# Patient Record
Sex: Female | Born: 1990 | Race: Black or African American | Hispanic: No | Marital: Single | Smoking: Current some day smoker
Health system: Southern US, Community
[De-identification: ages and names within clinical notes are randomized; demographics above are authoritative.]

## PROBLEM LIST (undated history)

## (undated) DIAGNOSIS — R569 Unspecified convulsions: Secondary | ICD-10-CM

## (undated) DIAGNOSIS — J45909 Unspecified asthma, uncomplicated: Secondary | ICD-10-CM

## (undated) HISTORY — PX: BRAIN TUMOR EXCISION: SHX577

---

## 2013-09-01 ENCOUNTER — Emergency Department (HOSPITAL_COMMUNITY)
Admission: EM | Admit: 2013-09-01 | Discharge: 2013-09-01 | Disposition: A | Discharge status: Home / Self Care | Payer: Self-pay | Attending: Emergency Medicine | Admitting: Emergency Medicine

## 2013-09-01 ENCOUNTER — Emergency Department (HOSPITAL_COMMUNITY): Payer: Self-pay

## 2013-09-01 ENCOUNTER — Encounter (HOSPITAL_COMMUNITY): Payer: Self-pay | Admitting: Emergency Medicine

## 2013-09-01 DIAGNOSIS — J069 Acute upper respiratory infection, unspecified: Secondary | ICD-10-CM | POA: Insufficient documentation

## 2013-09-01 DIAGNOSIS — F17200 Nicotine dependence, unspecified, uncomplicated: Secondary | ICD-10-CM | POA: Insufficient documentation

## 2013-09-01 MED ORDER — GUAIFENESIN 100 MG/5ML PO SYRP: 100.0000 mg | ORAL_SOLUTION | ORAL | Status: DC | PRN

## 2013-09-01 NOTE — ED Notes (Signed)
Pt c/o chest and nasal congestion with cough

## 2013-09-01 NOTE — ED Provider Notes (Signed)
CSN: [id number]     Arrival date & time 09/01/13  1205 History   First MD Initiated Contact with Patient 09/01/13 1223     Chief Complaint  Patient presents with  . URI   (Consider location/radiation/quality/duration/timing/severity/associated sxs/prior Treatment) HPI Comments: Patient presents today with a chief complaint of nasal congestion, chest congestion, and productive cough.  Symptoms have been present for the past 3 days and are gradually worsening.  She reports that she took an OTC cough medicine for her symptoms, but does not think that it helped.  Patient denies fever, chills, SOB, or chest pain.  She reports that she currently smokes 5 cigarettes a day.  She also reports past history of Asthma.  She denies any wheezing or SOB.  Patient is a 22 y.o. female presenting with URI. The history is provided by the patient.  URI Presenting symptoms: congestion and cough   Presenting symptoms: no fever     History reviewed. No pertinent past medical history. History reviewed. No pertinent past surgical history. No family history on file. History  Substance Use Topics  . Smoking status: Current Every Day Smoker    Types: Cigarettes  . Smokeless tobacco: Not on file  . Alcohol Use: No   OB History   Grav Para Term Preterm Abortions TAB SAB Ect Mult Living                 Review of Systems  Constitutional: Negative for fever and chills.  HENT: Positive for congestion. Negative for sinus pressure.   Respiratory: Positive for cough.   All other systems reviewed and are negative.    Allergies  Review of patient's allergies indicates no known allergies.  Home Medications  No current outpatient prescriptions on file. BP 101/59  Pulse 78  Temp(Src) 98.8 F (37.1 C) (Oral)  Resp 18  SpO2 99%  LMP 08/13/2013 Physical Exam  Nursing note and vitals reviewed. Constitutional: She appears well-developed and well-nourished.  HENT:  Head: Normocephalic and atraumatic.  Right  Ear: Tympanic membrane and ear canal normal.  Left Ear: Tympanic membrane and ear canal normal.  Nose: Rhinorrhea present. Right sinus exhibits no maxillary sinus tenderness and no frontal sinus tenderness. Left sinus exhibits no maxillary sinus tenderness and no frontal sinus tenderness.  Mouth/Throat: Uvula is midline, oropharynx is clear and moist and mucous membranes are normal.  Neck: Normal range of motion. Neck supple.  Cardiovascular: Normal rate, regular rhythm and normal heart sounds.   Pulmonary/Chest: Effort normal and breath sounds normal. No respiratory distress. She has no wheezes. She has no rales.  Neurological: She is alert.  Skin: Skin is warm and dry.  Psychiatric: She has a normal mood and affect.    ED Course  Procedures (including critical care time) Labs Review Labs Reviewed - No data to display Imaging Review Dg Chest 2 View  09/01/2013   CLINICAL DATA:  Dry cough, shortness of breath  EXAM: CHEST  2 VIEW  COMPARISON:  None.  FINDINGS: The heart size and mediastinal contours are within normal limits. Both lungs are clear. The visualized skeletal structures are unremarkable.  IMPRESSION: No active cardiopulmonary disease.   Electronically Signed   By: Elige Ko   On: 09/01/2013 13:22    EKG Interpretation   None       MDM  No diagnosis found. Pt CXR negative for acute infiltrate. Patients symptoms are consistent with URI, likely viral etiology. Discussed that antibiotics are not indicated for viral infections. Pt will  be discharged with symptomatic treatment.  Verbalizes understanding and is agreeable with plan. Pt is hemodynamically stable & in NAD prior to dc.     Santiago Glad, PA-C 09/03/13 [phone]

## 2013-09-05 NOTE — ED Provider Notes (Signed)
Medical screening examination/treatment/procedure(s) were performed by non-physician practitioner and as supervising physician I was immediately available for consultation/collaboration.  EKG Interpretation   None      Medical screening examination/treatment/procedure(s) were performed by non-physician practitioner and as supervising physician I was immediately available for consultation/collaboration.  EKG Interpretation   None        Raeford Razor, MD 09/05/13 [phone]

## 2015-02-09 ENCOUNTER — Encounter (HOSPITAL_COMMUNITY): Payer: Self-pay | Admitting: Family Medicine

## 2015-02-09 ENCOUNTER — Emergency Department (HOSPITAL_COMMUNITY)
Admission: EM | Admit: 2015-02-09 | Discharge: 2015-02-09 | Disposition: A | Discharge status: Home / Self Care | Payer: Medicaid - Out of State | Attending: Emergency Medicine | Admitting: Emergency Medicine

## 2015-02-09 DIAGNOSIS — R2243 Localized swelling, mass and lump, lower limb, bilateral: Secondary | ICD-10-CM | POA: Insufficient documentation

## 2015-02-09 DIAGNOSIS — R600 Localized edema: Secondary | ICD-10-CM

## 2015-02-09 DIAGNOSIS — Z791 Long term (current) use of non-steroidal anti-inflammatories (NSAID): Secondary | ICD-10-CM | POA: Insufficient documentation

## 2015-02-09 DIAGNOSIS — Z720 Tobacco use: Secondary | ICD-10-CM | POA: Diagnosis not present

## 2015-02-09 LAB — I-STAT BETA HCG BLOOD, ED (MC, WL, AP ONLY): I-stat hCG, quantitative: <5 m[IU]/mL (ref <5)

## 2015-02-09 LAB — I-STAT CHEM 8, ED
BUN: 9 mg/dL (ref 6–23)
CREATININE: 0.7 mg/dL (ref 0.50–1.10)
Calcium, Ion: 1.24 mmol/L — ABNORMAL HIGH (ref 1.12–1.23)
Chloride: 104 mmol/L (ref 96–112)
Glucose, Bld: 88 mg/dL (ref 70–99)
HCT: 35 % — ABNORMAL LOW (ref 36.0–46.0)
HEMOGLOBIN: 11.9 g/dL — AB (ref 12.0–15.0)
Potassium: 4 mmol/L (ref 3.5–5.1)
Sodium: 142 mmol/L (ref 135–145)
TCO2: 23 mmol/L (ref 0–100)

## 2015-02-09 MED ORDER — FUROSEMIDE 20 MG PO TABS: 20.0000 mg | ORAL_TABLET | Freq: Every day | ORAL | Status: DC

## 2015-02-09 MED ORDER — POTASSIUM CHLORIDE ER 10 MEQ PO TBCR: 10.0000 meq | EXTENDED_RELEASE_TABLET | Freq: Every day | ORAL | Status: DC

## 2015-02-09 NOTE — ED Notes (Signed)
Pt states she drove to [place] this weekend and is now experiencing edema to bilateral lower legs.

## 2015-02-09 NOTE — ED Provider Notes (Signed)
CSN: [id number]     Arrival date & time 02/09/15  9147 History   First MD Initiated Contact with Patient 02/09/15 [phone]     Chief Complaint  Patient presents with  . Leg Swelling     The history is provided by the patient. No language interpreter was used.   Jenna Combs presents for evaluation of lower extremity edema. He's had 2-3 days of diffuse lower extremity swelling. She has pain in her feet bilaterally when she stands due to swelling. The pain is described as burning sensation. She denies any medical problems and recently travel to [place] the weekend. The swelling started while she was in [place]. She denies any fevers, chest pain, shortness of breath, change in her urination, vomiting, diarrhea. Symptoms are moderate, constant, worsening. She has tried an over-the-counter diuretic without relief.  History reviewed. No pertinent past medical history. History reviewed. No pertinent past surgical history. History reviewed. No pertinent family history. History  Substance Use Topics  . Smoking status: Current Every Day Smoker    Types: Cigarettes  . Smokeless tobacco: Not on file  . Alcohol Use: No   OB History    No data available     Review of Systems  All other systems reviewed and are negative.     Allergies  Review of patient's allergies indicates no known allergies.  Home Medications   Prior to Admission medications   Medication Sig Start Date End Date Taking? Authorizing Provider  guaifenesin (ROBITUSSIN) 100 MG/5ML syrup Take 5-10 mLs (100-200 mg total) by mouth every 4 (four) hours as needed for cough. 09/01/13   Heather Laisure, PA-C  naproxen sodium (ANAPROX) 220 MG tablet Take 220 mg by mouth 2 (two) times daily with a meal.    Historical Provider, MD   Pulse 66  Temp(Src) 98.1 F (36.7 C) (Oral)  Resp 20  SpO2 100%  LMP 02/08/2015 Physical Exam  Constitutional: She is oriented to person, place, and time. She appears well-developed and  well-nourished.  HENT:  Head: Normocephalic and atraumatic.  Cardiovascular: Normal rate and regular rhythm.   No murmur heard. Pulmonary/Chest: Effort normal and breath sounds normal. No respiratory distress.  Abdominal: Soft. There is no tenderness. There is no rebound and no guarding.  Musculoskeletal:  2+ nonpitting edema in bilateral lower extremities, greatest over the feet. 2+ DP pulses bilaterally. There is no erythema or induration of bilateral lower extremities.  Neurological: She is alert and oriented to person, place, and time.  Skin: Skin is warm and dry.  Psychiatric: She has a normal mood and affect. Her behavior is normal.  Nursing note and vitals reviewed.   ED Course  Procedures (including critical care time) Labs Review Labs Reviewed  I-STAT CHEM 8, ED - Abnormal; Notable for the following:    Calcium, Ion 1.24 (*)    Hemoglobin 11.9 (*)    HCT 35.0 (*)    All other components within normal limits  I-STAT BETA HCG BLOOD, ED ([facility], [facility], [facility] ONLY)    Imaging Review No results found.   EKG Interpretation None      MDM   Final diagnoses:  Bilateral edema of lower extremity    Patient here for evaluation of lower extremity edema. History and presentation is not consistent with CHF. Renal function is within normal limits. Patient without risk factors or history concerning for liver failure. Patient is low risk for DVT, perk negative, edema is bilateral. Discussed with patient's home care for lower extremity edema as  well as PCP follow-up and return precautions.  Tilden FossaElizabeth Berna Gitto, MD 02/09/15 1048

## 2015-02-09 NOTE — Discharge Instructions (Signed)
Peripheral Edema °You have swelling in your legs (peripheral edema). This swelling is due to excess accumulation of salt and water in your body. Edema may be a sign of heart, kidney or liver disease, or a side effect of a medication. It may also be due to problems in the leg veins. Elevating your legs and using special support stockings may be very helpful, if the cause of the swelling is due to poor venous circulation. Avoid long periods of standing, whatever the cause. °Treatment of edema depends on identifying the cause. Chips, pretzels, pickles and other salty foods should be avoided. Restricting salt in your diet is almost always needed. Water pills (diuretics) are often used to remove the excess salt and water from your body via urine. These medicines prevent the kidney from reabsorbing sodium. This increases urine flow. °Diuretic treatment may also result in lowering of potassium levels in your body. Potassium supplements may be needed if you have to use diuretics daily. Daily weights can help you keep track of your progress in clearing your edema. You should call your caregiver for follow up care as recommended. °SEEK IMMEDIATE MEDICAL CARE IF:  °· You have increased swelling, pain, redness, or heat in your legs. °· You develop shortness of breath, especially when lying down. °· You develop chest or abdominal pain, weakness, or fainting. °· You have a fever. °Document Released: 11/10/2004 Document Revised: 12/26/2011 Document Reviewed: 10/21/2009 °Exit[employer]® Patient Information ©2015 [employer]. This information is not intended to replace advice given to you by your health care provider. Make sure you discuss any questions you have with your health care provider. ° °
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
## 2015-02-09 NOTE — ED Notes (Signed)
Pt having pain and swelling in both feet that started yesterday. Sig swelling. sts family hax of high blood pressure.

## 2016-12-18 ENCOUNTER — Encounter (HOSPITAL_COMMUNITY): Payer: Self-pay

## 2016-12-18 ENCOUNTER — Emergency Department (HOSPITAL_COMMUNITY): Payer: BLUE CROSS/BLUE SHIELD

## 2016-12-18 ENCOUNTER — Emergency Department (HOSPITAL_COMMUNITY)
Admission: EM | Admit: 2016-12-18 | Discharge: 2016-12-18 | Disposition: A | Discharge status: Home / Self Care | Payer: BLUE CROSS/BLUE SHIELD | Attending: Emergency Medicine | Admitting: Emergency Medicine

## 2016-12-18 DIAGNOSIS — Y929 Unspecified place or not applicable: Secondary | ICD-10-CM | POA: Insufficient documentation

## 2016-12-18 DIAGNOSIS — Y999 Unspecified external cause status: Secondary | ICD-10-CM | POA: Diagnosis not present

## 2016-12-18 DIAGNOSIS — Y939 Activity, unspecified: Secondary | ICD-10-CM | POA: Diagnosis not present

## 2016-12-18 DIAGNOSIS — S6991XA Unspecified injury of right wrist, hand and finger(s), initial encounter: Secondary | ICD-10-CM | POA: Diagnosis present

## 2016-12-18 DIAGNOSIS — S01412A Laceration without foreign body of left cheek and temporomandibular area, initial encounter: Secondary | ICD-10-CM | POA: Diagnosis not present

## 2016-12-18 DIAGNOSIS — Z23 Encounter for immunization: Secondary | ICD-10-CM | POA: Diagnosis not present

## 2016-12-18 DIAGNOSIS — S0181XA Laceration without foreign body of other part of head, initial encounter: Secondary | ICD-10-CM

## 2016-12-18 DIAGNOSIS — F17210 Nicotine dependence, cigarettes, uncomplicated: Secondary | ICD-10-CM | POA: Diagnosis not present

## 2016-12-18 DIAGNOSIS — S62656A Nondisplaced fracture of medial phalanx of right little finger, initial encounter for closed fracture: Secondary | ICD-10-CM | POA: Insufficient documentation

## 2016-12-18 DIAGNOSIS — J45909 Unspecified asthma, uncomplicated: Secondary | ICD-10-CM | POA: Diagnosis not present

## 2016-12-18 HISTORY — DX: Unspecified convulsions: R56.9

## 2016-12-18 HISTORY — DX: Unspecified asthma, uncomplicated: J45.909

## 2016-12-18 MED ORDER — LIDOCAINE HCL 2 % IJ SOLN
20.0000 mL | Freq: Once | INTRAMUSCULAR | Status: AC
  Administered 2016-12-18: 400 mg
  Filled 2016-12-18: qty 20

## 2016-12-18 MED ORDER — TETANUS-DIPHTH-ACELL PERTUSSIS 5-2.5-18.5 LF-MCG/0.5 IM SUSP
0.5000 mL | Freq: Once | INTRAMUSCULAR | Status: AC
  Administered 2016-12-18: 0.5 mL via INTRAMUSCULAR
  Filled 2016-12-18: qty 0.5
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
## 2016-12-18 NOTE — Discharge Instructions (Signed)
Please read attached information. If you experience any new or worsening signs or symptoms please return to the emergency room for evaluation. Please follow-up with your primary care provider or specialist as discussed.  °
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
## 2016-12-19 NOTE — ED Notes (Signed)
12/19/2016 09:53 Pt's chart was accessed due to pt calling to inquire about getting prescription pain medications. Advised pt no meds were prescribed at discharge. Pt transferred to triage RN hotline when she asked for medical advice.

## 2016-12-22 ENCOUNTER — Emergency Department (HOSPITAL_COMMUNITY)
Admission: EM | Admit: 2016-12-22 | Discharge: 2016-12-22 | Disposition: A | Discharge status: Home / Self Care | Payer: Medicaid - Out of State | Attending: Emergency Medicine | Admitting: Emergency Medicine

## 2016-12-22 ENCOUNTER — Emergency Department (HOSPITAL_COMMUNITY): Payer: Medicaid - Out of State

## 2016-12-22 ENCOUNTER — Encounter (HOSPITAL_COMMUNITY): Payer: Self-pay | Admitting: Emergency Medicine

## 2016-12-22 DIAGNOSIS — J45909 Unspecified asthma, uncomplicated: Secondary | ICD-10-CM | POA: Insufficient documentation

## 2016-12-22 DIAGNOSIS — Y999 Unspecified external cause status: Secondary | ICD-10-CM | POA: Insufficient documentation

## 2016-12-22 DIAGNOSIS — Y939 Activity, unspecified: Secondary | ICD-10-CM | POA: Insufficient documentation

## 2016-12-22 DIAGNOSIS — Y929 Unspecified place or not applicable: Secondary | ICD-10-CM | POA: Diagnosis not present

## 2016-12-22 DIAGNOSIS — X58XXXA Exposure to other specified factors, initial encounter: Secondary | ICD-10-CM | POA: Diagnosis not present

## 2016-12-22 DIAGNOSIS — F17210 Nicotine dependence, cigarettes, uncomplicated: Secondary | ICD-10-CM | POA: Insufficient documentation

## 2016-12-22 DIAGNOSIS — Z79899 Other long term (current) drug therapy: Secondary | ICD-10-CM | POA: Diagnosis not present

## 2016-12-22 DIAGNOSIS — M79642 Pain in left hand: Secondary | ICD-10-CM

## 2016-12-22 DIAGNOSIS — S40022A Contusion of left upper arm, initial encounter: Secondary | ICD-10-CM | POA: Diagnosis not present

## 2016-12-22 DIAGNOSIS — S4991XA Unspecified injury of right shoulder and upper arm, initial encounter: Secondary | ICD-10-CM | POA: Diagnosis present

## 2016-12-22 MED ORDER — DICLOFENAC SODIUM 50 MG PO TBEC: 50.0000 mg | DELAYED_RELEASE_TABLET | Freq: Two times a day (BID) | ORAL | 0 refills | Status: DC

## 2016-12-22 NOTE — ED Notes (Signed)
Pt returned from [facility].
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
## 2016-12-22 NOTE — Discharge Instructions (Signed)
Wear the splint and follow up with Dr. Janee Mornhompson, the referral from your first visit as planned.

## 2016-12-22 NOTE — ED Notes (Signed)
Patient Alert and oriented X4. Stable and ambulatory. Patient verbalized understanding of the discharge instructions.  Patient belongings were taken by the patient.  

## 2016-12-22 NOTE — Progress Notes (Signed)
Orthopedic Tech Progress Note Patient Details:  Jenna Combs 09-17-1991 [id number]  Ortho Devices Type of Ortho Device: Finger splint Ortho Device/Splint Location: LUE  Ortho Device/Splint Interventions: Ordered, Application   [name], [name] 12/22/2016, 10:42 PM

## 2016-12-22 NOTE — ED Provider Notes (Signed)
[facility]-EMERGENCY DEPT Provider Note   CSN: [id number] Arrival date & time: 12/22/16  2119  By signing my name below, I, Jenna Combs, attest that this documentation has been prepared under the direction and in the presence of  [facility]. Damian LeavellNeese, NP. Electronically Signed: Doreatha MartinEva Combs, ED Scribe. 12/22/16. 10:12 PM.    History   Chief Complaint Chief Complaint  Patient presents with  . Hand Pain    HPI Jenna Combs is a [age] y.o. female who presents to the Emergency Department complaining of moderate, worsened left pinky pain with radiation to the hand s/p injury that occurred 4 days ago. Pt was seen in the ER after the initial injury, had XR showing fracture at the base of the fifth middle phalanx along the volar surface extending into the proximal interphalangeal joint with minimal displacement, and was discharged with finger splint. Per pt, her pain is not well controlled and is worsened with finger movement. Pt denies taking OTC medications at home to improve symptoms. She has not yet followed up with the hand surgeon. She denies numbness, open wounds.   The history is provided by the patient. No language interpreter was used.  Hand Pain  This is a new problem. The current episode started more than 2 days ago. The problem occurs constantly. The problem has been gradually worsening. The symptoms are aggravated by bending. Nothing relieves the symptoms. She has tried nothing for the symptoms. The treatment provided no relief.    Past Medical History:  Diagnosis Date  . Asthma   . Seizures (HCC)     There are no active problems to display for this patient.   History reviewed. No pertinent surgical history.  OB History    No data available       Home Medications    Prior to Admission medications   Medication Sig Start Date End Date Taking? Authorizing Provider  acetaminophen (TYLENOL) 500 MG tablet Take 500-1,000 mg by mouth every 6 (six) hours as needed for mild pain or  moderate pain.    Historical Provider, MD  albuterol (PROVENTIL HFA;VENTOLIN HFA) 108 (90 BASE) MCG/ACT inhaler Inhale 1-2 puffs into the lungs every 6 (six) hours as needed for wheezing or shortness of breath.    Historical Provider, MD  diclofenac (VOLTAREN) 50 MG EC tablet Take 1 tablet (50 mg total) by mouth 2 (two) times daily. 12/22/16   Casady Voshell Orlene OchM Basil Buffin, NP  furosemide (LASIX) 20 MG tablet Take 1 tablet (20 mg total) by mouth daily. 02/09/15   Tilden FossaElizabeth Rees, MD  guaifenesin (ROBITUSSIN) 100 MG/5ML syrup Take 5-10 mLs (100-200 mg total) by mouth every 4 (four) hours as needed for cough. Patient not taking: Reported on 02/09/2015 09/01/13   Santiago GladHeather Laisure, PA-C  OVER THE COUNTER MEDICATION Take 1-2 capsules by mouth daily as needed (fluid retention). OTC fluid supplement    Historical Provider, MD  potassium chloride (K-DUR) 10 MEQ tablet Take 1 tablet (10 mEq total) by mouth daily. 02/09/15   Tilden FossaElizabeth Rees, MD    Family History No family history on file.  Social History Social History  Substance Use Topics  . Smoking status: Current Every Day Smoker    Packs/day: 0.75    Types: Cigarettes  . Smokeless tobacco: Never Used  . Alcohol use No     Allergies   Patient has no known allergies.   Review of Systems Review of Systems  Musculoskeletal: Positive for arthralgias.  Skin: Negative for wound.  Neurological: Negative for numbness.  Physical Exam Updated Vital Signs BP 121/66   Pulse 79   Temp 98.2 F (36.8 C) (Oral)   Resp 18   Ht 5\' 4"  (1.626 m)   Wt 90.7 kg   LMP 12/22/2016 (Exact Date)   SpO2 100%   BMI 34.33 kg/m   Physical Exam  Constitutional: She appears well-developed and well-nourished.  HENT:  Head: Normocephalic and atraumatic.  Mouth/Throat: Oropharynx is clear and moist and mucous membranes are normal.  Eyes: Pupils are equal, round, and reactive to light.  Neck: Normal range of motion.  Cardiovascular: Normal rate.   Radial pulses are 2+.  Adequate circulation.   Pulmonary/Chest: Effort normal. No respiratory distress.  Musculoskeletal: Normal range of motion.       Left hand: She exhibits tenderness. She exhibits no thumb/finger opposition.  3 cm area of ecchymosis to the right upper arm on the palmar aspect. Negative finger thumb opposition. Swelling, tenderness and ecchymosis to the left little finger, especially to the PIP.   Neurological: She is alert.  Skin: Skin is warm and dry. Capillary refill takes less than 2 seconds.  Psychiatric: She has a normal mood and affect. Her behavior is normal.  Nursing note and vitals reviewed.   ED Treatments / Results   DIAGNOSTIC STUDIES: Oxygen Saturation is 95% on RA, adequate by my interpretation.    COORDINATION OF CARE: 10:11 PM Discussed treatment plan with pt at bedside which includes XR and pt agreed to plan.    Radiology Dg Hand Complete Left  Result Date: 12/22/2016 CLINICAL DATA:  Left hand pain EXAM: LEFT HAND - COMPLETE 3+ VIEW COMPARISON:  12/18/2016 FINDINGS: Again visualized is a nondisplaced fracture involving the volar base of the fifth middle phalanx. There are no other acute displaced fractures. No subluxation. IMPRESSION: 1. Small intra-articular fracture volar base of the fifth second proximal phalanx, not significantly changed 2. There are no other acute fractures visualized. Electronically Signed   By: Jasmine Pang M.D.   On: 12/22/2016 22:13    Procedures Procedures (including critical care time)  Medications Ordered in ED Medications - No data to display   Initial Impression / Assessment and Plan / ED Course  I have reviewed the triage vital signs and the nursing notes.  Pertinent labs & imaging results that were available during my care of the patient were reviewed by me and considered in my medical decision making (see chart for details).     Patient X-Ray shows small intra-articular fracture volar base of the fifth second proximal phalanx,  not significantly changed.  Pt advised to follow up with hand surgery. Patient given new splint while in ED, conservative therapy recommended and discussed. Patient will be discharged home & is agreeable with above plan. Returns precautions discussed. Pt appears safe for discharge.   Final Clinical Impressions(s) / ED Diagnoses   Final diagnoses:  Left hand pain    New Prescriptions Discharge Medication List as of 12/22/2016 11:13 PM    START taking these medications   Details  diclofenac (VOLTAREN) 50 MG EC tablet Take 1 tablet (50 mg total) by mouth 2 (two) times daily., Starting Thu 12/22/2016, Print       I personally performed the services described in this documentation, which was scribed in my presence. The recorded information has been reviewed and is accurate.    [address] [place], [place] 12/24/16 1610    Gwyneth Sprout, MD 12/26/16 [phone]

## 2016-12-22 NOTE — ED Notes (Signed)
Patient transported to X-ray 

## 2017-09-13 ENCOUNTER — Other Ambulatory Visit: Payer: Self-pay

## 2017-09-13 ENCOUNTER — Emergency Department (HOSPITAL_COMMUNITY)
Admission: EM | Admit: 2017-09-13 | Discharge: 2017-09-13 | Disposition: A | Discharge status: Home / Self Care | Payer: Medicaid Other | Attending: Emergency Medicine | Admitting: Emergency Medicine

## 2017-09-13 ENCOUNTER — Encounter (HOSPITAL_COMMUNITY): Payer: Self-pay | Admitting: *Deleted

## 2017-09-13 ENCOUNTER — Emergency Department (HOSPITAL_COMMUNITY): Payer: Medicaid Other

## 2017-09-13 DIAGNOSIS — Z79899 Other long term (current) drug therapy: Secondary | ICD-10-CM | POA: Diagnosis not present

## 2017-09-13 DIAGNOSIS — F17210 Nicotine dependence, cigarettes, uncomplicated: Secondary | ICD-10-CM | POA: Insufficient documentation

## 2017-09-13 DIAGNOSIS — M545 Low back pain, unspecified: Secondary | ICD-10-CM

## 2017-09-13 DIAGNOSIS — J45909 Unspecified asthma, uncomplicated: Secondary | ICD-10-CM | POA: Insufficient documentation

## 2017-09-13 LAB — POC URINE PREG, ED: PREG TEST UR: NEGATIVE

## 2017-09-13 MED ORDER — CYCLOBENZAPRINE HCL 10 MG PO TABS: 10.0000 mg | ORAL_TABLET | Freq: Every evening | ORAL | 0 refills | Status: DC | PRN

## 2017-09-13 MED ORDER — KETOROLAC TROMETHAMINE 30 MG/ML IJ SOLN
30.0000 mg | Freq: Once | INTRAMUSCULAR | Status: AC
  Administered 2017-09-13: 30 mg via INTRAMUSCULAR
  Filled 2017-09-13: qty 1

## 2017-09-13 NOTE — ED Triage Notes (Signed)
Pt states mid lower back pain since yesterday when she bent down to pick up her son.

## 2017-09-13 NOTE — ED Provider Notes (Signed)
[facility]   CSN: [id number] Arrival date & time: 09/13/17  [id number]     History   Chief Complaint Chief Complaint  Patient presents with  . Back Pain    HPI Jenna Combs is a 26 y.o. female who presents today for evaluation of acute on chronic lower back pain.  She reports that she has had lower back pain since she gave birth approximately 8 years ago.  She reports that this morning she was getting her son ready for school when she bent down and felt her back "give out."  She reports that she took an unknown pill from her mother this morning, however has not significant relief of her pain.  She has been ambulatory since this happened.  She denies any bowel or bladder dysfunction.  No numbness or tingling on her upper inner thighs or genitals.  She denies any history of IV drug use.  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  She states she did not require chemo or radiation, that it was a benign tumor.  No recent fevers or chills.  HPI  Past Medical History:  Diagnosis Date  . Asthma   . Seizures (HCC)     There are no active problems to display for this patient.   Past Surgical History:  Procedure Laterality Date  . BRAIN TUMOR EXCISION     As a child    OB History    No data available       Home Medications    Prior to Admission medications   Medication Sig Start Date End Date Taking? Authorizing Provider  acetaminophen (TYLENOL) 500 MG tablet Take 500-1,000 mg by mouth every 6 (six) hours as needed for mild pain or moderate pain.    [provider]  albuterol (PROVENTIL HFA;VENTOLIN HFA) 108 (90 BASE) MCG/ACT inhaler Inhale 1-2 puffs into the lungs every 6 (six) hours as needed for wheezing or shortness of breath.    [provider]  cyclobenzaprine (FLEXERIL) 10 MG tablet Take 1 tablet (10 mg total) by mouth at bedtime as needed for muscle spasms.  09/13/17   Cristina GongHammond, Elizabeth W, PA-C  diclofenac (VOLTAREN) 50 MG EC tablet Take 1 tablet (50 mg total) by mouth 2 (two) times daily. 12/22/16   Janne NapoleonNeese, Hope M, NP  furosemide (LASIX) 20 MG tablet Take 1 tablet (20 mg total) by mouth daily. 02/09/15   Tilden Fossaees, Elizabeth, MD  guaifenesin (ROBITUSSIN) 100 MG/5ML syrup Take 5-10 mLs (100-200 mg total) by mouth every 4 (four) hours as needed for cough. Patient not taking: Reported on 02/09/2015 09/01/13   Santiago GladLaisure, Heather, PA-C  OVER THE COUNTER MEDICATION Take 1-2 capsules by mouth daily as needed (fluid retention). OTC fluid supplement    [provider]  potassium chloride (K-DUR) 10 MEQ tablet Take 1 tablet (10 mEq total) by mouth daily. 02/09/15   Tilden Fossaees, Elizabeth, MD    Family History No family history on file.  Social History Social History   Tobacco Use  . Smoking status: Current Every Day Smoker    Packs/day: 0.25    Years: 0.00    Pack years: 0.00    Types: Cigarettes  . Smokeless tobacco: Never Used  Substance Use Topics  . Alcohol use: No  . Drug use: No     Allergies   Mold extract [trichophyton]   Review of Systems Review of Systems  Constitutional: Negative for chills  and fever.  Respiratory: Negative for choking and shortness of breath.   Gastrointestinal: Negative for abdominal pain, constipation, diarrhea, nausea and vomiting.  Musculoskeletal: Positive for back pain. Negative for gait problem, neck pain and neck stiffness.  Skin: Negative for color change, rash and wound.  Neurological: Negative for weakness and numbness.     Physical Exam Updated Vital Signs BP 98/63   Pulse 66   Temp 98 F (36.7 C) (Oral)   Resp 16   Ht 5\' 5"  (1.651 m)   Wt 95.3 kg (210 lb)   LMP 09/08/2017   SpO2 100%   BMI 34.95 kg/m   Physical Exam  Constitutional: She appears well-developed and well-nourished.  HENT:  Head: Normocephalic and atraumatic.  Cardiovascular: Normal rate and intact distal pulses.  2+ DP/PT  pulses bilaterally.   Pulmonary/Chest: Effort normal. No respiratory distress.  Abdominal: Soft. She exhibits no distension. There is no tenderness.  Musculoskeletal: Normal range of motion.  TTP over lateral lumbar areas.  Palpation here both re-creates and exacerbates her pain.  She does not have any midline TTP.  Her lumbar paraspinal muscles feel tight consistent with spasm.   5/5 strength bilateral lower extremity flexion and extension at hip, knee and ankle.   Neurological: She is alert. No sensory deficit.  Normal gait.  Sensation intact to bilateral lower extremtities.   Skin: Skin is warm and dry. She is not diaphoretic.  Psychiatric: She has a normal mood and affect. Her behavior is normal.  Nursing note and vitals reviewed.    ED Treatments / Results  Labs (all labs ordered are listed, but only abnormal results are displayed) Labs Reviewed  POC URINE PREG, ED    EKG  EKG Interpretation None       Radiology Dg Lumbar Spine Complete  Result Date: 09/13/2017 CLINICAL DATA:  Back spasm.  No injury. EXAM: LUMBAR SPINE - COMPLETE 4+ VIEW COMPARISON:  None. FINDINGS: Anatomic alignment. No vertebral compression. Mild facet arthropathy at L4-5 and L5-S1. No definite fracture. IMPRESSION: No acute bony pathology. Electronically Signed   By: Jolaine ClickArthur  Hoss M.D.   On: 09/13/2017 10:44    Procedures Procedures (including critical care time)  Medications Ordered in ED Medications  ketorolac (TORADOL) 30 MG/ML injection 30 mg (30 mg Intramuscular Given 09/13/17 0939)     Initial Impression / Assessment and Plan / ED Course  I have reviewed the triage vital signs and the nursing notes.  Pertinent labs & imaging results that were available during my care of the patient were reviewed by me and considered in my medical decision making (see chart for details).     Patient with back pain.  No neurological deficits and normal neuro exam.  Patient can walk but states is  painful.  No loss of bowel or bladder control.  No concern for cauda equina.  No fever, night sweats, weight loss,  IVDU.  Hx of benign brain tumor in childhood, lumbar x-rays unremarkable.  RICE protocol and pain medicine indicated and discussed with patient.  Return precautions given.    Final Clinical Impressions(s) / ED Diagnoses   Final diagnoses:  Bilateral low back pain without sciatica, unspecified chronicity    ED Discharge Orders        Ordered    cyclobenzaprine (FLEXERIL) 10 MG tablet  At bedtime PRN     09/13/17 1112       Cristina GongHammond, Elizabeth W, [place]PA-C 09/13/17 1113    Cathren LaineSteinl, Kevin, MD 09/13/17 1325

## 2017-09-13 NOTE — ED Notes (Signed)
Patient given discharge instructions and verbalized understanding.  Patient stable to discharge at this time.  Patient is alert and oriented to baseline.  No distressed noted at this time.  All belongings taken with the patient at discharge.   

## 2017-09-13 NOTE — ED Notes (Signed)
Care handoff to Chris RN 

## 2017-09-13 NOTE — ED Notes (Signed)
POC preg test negative.  

## 2017-09-13 NOTE — ED Notes (Signed)
Patient transported to X-ray 

## 2017-09-13 NOTE — Discharge Instructions (Signed)
Please take Ibuprofen (Advil, motrin) and Tylenol (acetaminophen) to relieve your pain.  You may take up to 600 MG (3 pills) of normal strength ibuprofen every 8 hours as needed.  In between doses of ibuprofen you make take tylenol, up to 1,000 mg (two extra strength pills).  Do not take more than 3,000 mg tylenol in a 24 hour period.  Please check all medication labels as many medications such as pain and cold medications may contain tylenol.  Do not drink alcohol while taking these medications.  Do not take other NSAID'S while taking ibuprofen (such as aleve or naproxen).  Please take ibuprofen with food to decrease stomach upset.  Please wait at least 8 hours until you take any ibuprofen.   The best way to get rid of muscle pain is by taking NSAIDS, using heat, massage therapy, and gentle stretching/range of motion exercises.   You are being prescribed a medication which may make you sleepy. For 24 hours after one dose please do not drive, operate heavy machinery, care for a small child with out another adult present, or perform any activities that may cause harm to you or someone else if you were to fall asleep or be impaired.   Please get a primary care doctor and see them.

## 2018-01-13 ENCOUNTER — Emergency Department (HOSPITAL_COMMUNITY): Payer: Medicaid Other

## 2018-01-13 ENCOUNTER — Encounter (HOSPITAL_COMMUNITY): Payer: Self-pay | Admitting: Emergency Medicine

## 2018-01-13 ENCOUNTER — Emergency Department (HOSPITAL_COMMUNITY)
Admission: EM | Admit: 2018-01-13 | Discharge: 2018-01-13 | Disposition: A | Discharge status: Home / Self Care | Payer: Medicaid Other | Attending: Emergency Medicine | Admitting: Emergency Medicine

## 2018-01-13 DIAGNOSIS — J45909 Unspecified asthma, uncomplicated: Secondary | ICD-10-CM | POA: Insufficient documentation

## 2018-01-13 DIAGNOSIS — Z79899 Other long term (current) drug therapy: Secondary | ICD-10-CM | POA: Insufficient documentation

## 2018-01-13 DIAGNOSIS — F17210 Nicotine dependence, cigarettes, uncomplicated: Secondary | ICD-10-CM | POA: Diagnosis not present

## 2018-01-13 DIAGNOSIS — K5900 Constipation, unspecified: Secondary | ICD-10-CM | POA: Diagnosis present

## 2018-01-13 LAB — COMPREHENSIVE METABOLIC PANEL
ALBUMIN: 3.8 g/dL (ref 3.5–5.0)
ALT: 16 U/L (ref 14–54)
AST: 19 U/L (ref 15–41)
Alkaline Phosphatase: 43 U/L (ref 38–126)
BILIRUBIN TOTAL: 0.3 mg/dL (ref 0.3–1.2)
BUN: 6 mg/dL (ref 6–20)
CHLORIDE: 106 mmol/L (ref 101–111)
CO2: 27 mmol/L (ref 22–32)
CREATININE: 0.77 mg/dL (ref 0.44–1.00)
Calcium: 9 mg/dL (ref 8.9–10.3)
GFR calc Af Amer: >60 mL/min (ref >60)
GFR calc non Af Amer: >60 mL/min (ref >60)
GLUCOSE: 90 mg/dL (ref 65–99)
Potassium: 4 mmol/L (ref 3.5–5.1)
Sodium: 141 mmol/L (ref 135–145)
Total Protein: 6.9 g/dL (ref 6.5–8.1)

## 2018-01-13 LAB — PREGNANCY, URINE: Preg Test, Ur: NEGATIVE

## 2018-01-13 LAB — URINALYSIS, ROUTINE W REFLEX MICROSCOPIC
Bilirubin Urine: NEGATIVE
Glucose, UA: NEGATIVE mg/dL
Hgb urine dipstick: NEGATIVE
Ketones, ur: 5 mg/dL — AB
Leukocytes, UA: NEGATIVE
NITRITE: NEGATIVE
Protein, ur: NEGATIVE mg/dL
Specific Gravity, Urine: 1.02 (ref 1.005–1.030)
pH: 5 (ref 5.0–8.0)

## 2018-01-13 LAB — LIPASE, BLOOD: LIPASE: 33 U/L (ref 11–51)

## 2018-01-13 LAB — CBC
HEMATOCRIT: 38.9 % (ref 36.0–46.0)
Hemoglobin: 12.7 g/dL (ref 12.0–15.0)
MCH: 30.7 pg (ref 26.0–34.0)
MCHC: 32.6 g/dL (ref 30.0–36.0)
MCV: 94 fL (ref 78.0–100.0)
Platelets: 278 10*3/uL (ref 150–400)
RBC: 4.14 MIL/uL (ref 3.87–5.11)
RDW: 13.8 % (ref 11.5–15.5)
WBC: 4.6 10*3/uL (ref 4.0–10.5)

## 2018-01-13 LAB — I-STAT BETA HCG BLOOD, ED (MC, WL, AP ONLY): I-stat hCG, quantitative: <5 m[IU]/mL (ref <5)

## 2018-01-13 MED ORDER — FLEET ENEMA 7-19 GM/118ML RE ENEM
1.0000 | ENEMA | Freq: Once | RECTAL | Status: AC
  Administered 2018-01-13: 1 via RECTAL
  Filled 2018-01-13: qty 1

## 2018-01-13 MED ORDER — DOCUSATE SODIUM 100 MG PO CAPS: 100.0000 mg | ORAL_CAPSULE | Freq: Two times a day (BID) | ORAL | 0 refills | Status: DC

## 2018-01-13 MED ORDER — POLYETHYLENE GLYCOL 3350 17 G PO PACK
17.0000 g | PACK | Freq: Every day | ORAL | 0 refills | Status: DC
Start: 2018-01-13 — End: 2018-05-14

## 2018-01-13 NOTE — ED Notes (Signed)
Pt is utilizing bedside commode.

## 2018-01-13 NOTE — ED Provider Notes (Signed)
[facility]-EMERGENCY DEPT Provider Note   CSN: [id number] Arrival date & time: 01/13/18  1027     History   Chief Complaint Chief Complaint  Patient presents with  . Constipation    HPI Jenna Combs is a 27 y.o. female.  HPI  Jenna Combs is a 27yo female with a history of asthma who presents to the Emergency Department for evaluation of constipation. Patient reports that she has not had a normal bowel movement for about a week now. States she normally has a bowel movement every other day and has struggled with constipation in the past. She has taken X-lax, Miralax and OTC suppository. Two days ago she had diarrhea and three episodes of vomiting. She feels somewhat nauseated today, but denies vomiting. Reports her abdomen feels distended and full of stool. States she has 10/10 severity generalized abdominal fullness. She reports that she is able to pass gas. She denies previous abdominal surgeries. Denies fever, chills, melena, hematochezia, dysuria, urinary frequency, flank pain, vaginal discharge, chest pain, sob, light headedness.   Past Medical History:  Diagnosis Date  . Asthma   . Seizures (HCC)     There are no active problems to display for this patient.   Past Surgical History:  Procedure Laterality Date  . BRAIN TUMOR EXCISION     As a child     OB History   None      Home Medications    Prior to Admission medications   Medication Sig Start Date End Date Taking? Authorizing Provider  acetaminophen (TYLENOL) 500 MG tablet Take 500-1,000 mg by mouth every 6 (six) hours as needed for mild pain or moderate pain.    [provider]  albuterol (PROVENTIL HFA;VENTOLIN HFA) 108 (90 BASE) MCG/ACT inhaler Inhale 1-2 puffs into the lungs every 6 (six) hours as needed for wheezing or shortness of breath.    [provider]  cyclobenzaprine (FLEXERIL) 10 MG tablet Take 1 tablet (10 mg total) by mouth at bedtime as needed for  muscle spasms. 09/13/17   Cristina Gong, PA-C  diclofenac (VOLTAREN) 50 MG EC tablet Take 1 tablet (50 mg total) by mouth 2 (two) times daily. 12/22/16   Janne Napoleon, NP  furosemide (LASIX) 20 MG tablet Take 1 tablet (20 mg total) by mouth daily. 02/09/15   Tilden Fossa, MD  guaifenesin (ROBITUSSIN) 100 MG/5ML syrup Take 5-10 mLs (100-200 mg total) by mouth every 4 (four) hours as needed for cough. Patient not taking: Reported on 02/09/2015 09/01/13   Santiago Glad, PA-C  OVER THE COUNTER MEDICATION Take 1-2 capsules by mouth daily as needed (fluid retention). OTC fluid supplement    [provider]  potassium chloride (K-DUR) 10 MEQ tablet Take 1 tablet (10 mEq total) by mouth daily. 02/09/15   Tilden Fossa, MD    Family History No family history on file.  Social History Social History   Tobacco Use  . Smoking status: Current Every Day Smoker    Packs/day: 0.25    Years: 0.00    Pack years: 0.00    Types: Cigarettes  . Smokeless tobacco: Never Used  Substance Use Topics  . Alcohol use: No  . Drug use: No     Allergies   Mold extract [trichophyton]   Review of Systems Review of Systems  Constitutional: Negative for chills and fever.  Eyes: Negative for visual disturbance.  Respiratory: Negative for shortness of breath.   Cardiovascular: Negative for chest pain.  Gastrointestinal: Positive  for abdominal pain, constipation and nausea. Negative for anal bleeding, blood in stool and vomiting.  Genitourinary: Negative for difficulty urinating, dysuria, flank pain, frequency, hematuria and vaginal discharge.  Musculoskeletal: Negative for back pain.  Skin: Negative for rash.  Neurological: Negative for dizziness and light-headedness.  Psychiatric/Behavioral: Negative for agitation.     Physical Exam Updated Vital Signs BP 107/62 (BP Location: Left Arm)   Pulse 74   Temp 98.4 F (36.9 C) (Oral)   Resp 18   LMP 01/06/2018   SpO2 100%   Physical  Exam  Constitutional: She is oriented to person, place, and time. She appears well-developed and well-nourished. No distress.  HENT:  Head: Normocephalic and atraumatic.  Mouth/Throat: Oropharynx is clear and moist. No oropharyngeal exudate.  Eyes: Pupils are equal, round, and reactive to light. Conjunctivae are normal. Right eye exhibits no discharge. Left eye exhibits no discharge.  Neck: Normal range of motion. Neck supple.  Cardiovascular: Normal rate, regular rhythm and intact distal pulses. Exam reveals no friction rub.  No murmur heard. Pulmonary/Chest: Effort normal and breath sounds normal. No stridor. No respiratory distress. She has no wheezes. She has no rales.  Abdominal:  Abdomen soft, appears somewhat distended. Non-tender to palpation. No guarding or rigidity.  No CVA tenderness.   Genitourinary:  Genitourinary Comments: Chaperone present for exam. No gross blood noted on rectal exam, normal tone, no tenderness, no mass or fissure, no hemorrhoids noted.  Musculoskeletal: Normal range of motion.  Neurological: She is alert and oriented to person, place, and time. Coordination normal.  Skin: Skin is warm and dry. Capillary refill takes less than 2 seconds. She is not diaphoretic.  Psychiatric: She has a normal mood and affect. Her behavior is normal.  Nursing note and vitals reviewed.    ED Treatments / Results  Labs (all labs ordered are listed, but only abnormal results are displayed) Labs Reviewed  URINALYSIS, ROUTINE W REFLEX MICROSCOPIC - Abnormal; Notable for the following components:      Result Value   APPearance HAZY (*)    Ketones, ur 5 (*)    All other components within normal limits  LIPASE, BLOOD  CBC  COMPREHENSIVE METABOLIC PANEL  PREGNANCY, URINE  I-STAT BETA HCG BLOOD, ED ([facility], [facility], [facility] ONLY)    EKG None  Radiology Dg Abdomen 1 View  Result Date: 01/13/2018 CLINICAL DATA:  Constipation, acute upper abdominal pain. EXAM: ABDOMEN - 1 VIEW  COMPARISON:  None. FINDINGS: No abnormal bowel dilatation is noted. Phleboliths are noted in the pelvis. Moderate amount of stool seen throughout the colon. IMPRESSION: Moderate stool burden is noted. No evidence of bowel obstruction or ileus. Electronically Signed   By: Lupita Raider, M.D.   On: 01/13/2018 17:17    Procedures Procedures (including critical care time)  Medications Ordered in ED Medications  sodium phosphate (FLEET) 7-19 GM/118ML enema 1 enema (1 enema Rectal Given 01/13/18 1820)     Initial Impression / Assessment and Plan / ED Course  I have reviewed the triage vital signs and the nursing notes.  Pertinent labs & imaging results that were available during my care of the patient were reviewed by me and considered in my medical decision making (see chart for details).    Presents to the ER with constipation and generalized abdominal fullness.   On exam she is afebrile and NAD. Abdomen soft, somewhat distended. Non-tender to palpation. No guarding or rigidity. No impaction with rectal exam.     Abdominal x-ray reveals  moderate stool burden, no evidence of obstruction. Otherwise lab work reassuring. CBC, CMP, Lipase WNL. Urine pregnancy negative. UA without signs of infection, no hematuria. No concern for acute bowel obstruction, perforated viscus, appendicitis, cholecystitis, diverticulitis, infectious colitis, pancreatitis, nephrolithiasis given exam and lab work. Patient received Fleet enema and had a large bowel movement. States that she feels much improved. Has no complaints.   Will start regular bowel regimen of Miralax and Colace. Discussed follow up with her PCP for recheck. Discussed reasons to return to the ER. Patient agrees and voices understanding to this plan.    Final Clinical Impressions(s) / ED Diagnoses   Final diagnoses:  Constipation, unspecified constipation type    ED Discharge Orders        Ordered    polyethylene glycol (MIRALAX) packet  Daily      01/13/18 2017    docusate sodium (COLACE) 100 MG capsule  Every 12 hours     01/13/18 2017       Kellie ShropshireShrosbree, Mayia Megill J, PA-C 01/13/18 2341    Charlynne PanderYao, David Hsienta, MD 01/14/18 [phone]

## 2018-01-13 NOTE — ED Notes (Signed)
Pt son has been moved to hallway B location while mother gets Enema.

## 2018-01-13 NOTE — ED Triage Notes (Signed)
Pt c/o constipation for couple days and tried taking laxatives without any relief. Having abd pains. Denies urinary problems or vomiting but had nausea.

## 2018-01-13 NOTE — Discharge Instructions (Signed)
Your blood work was reassuring.  X-ray showed that you were constipated.  Please take MiraLAX and Colace daily.  If you have diarrhea you can skip a day as we discussed.  Please follow-up with your regular doctor about your ER visit today.  Return to the emergency department if you have any new or concerning symptoms including fever, worsening abdominal pain, vomiting that does not stop.

## 2018-01-13 NOTE — ED Notes (Signed)
Pt has been placed on side lateral position with bedside commode placed in room. Pt was instructed to hold for minimum of 15 minutes to 20 minutes. Pt understood and was given call bell and instructions.

## 2018-01-20 ENCOUNTER — Emergency Department (HOSPITAL_COMMUNITY): Payer: Medicaid Other

## 2018-01-20 ENCOUNTER — Encounter (HOSPITAL_COMMUNITY): Payer: Self-pay | Admitting: Emergency Medicine

## 2018-01-20 ENCOUNTER — Emergency Department (HOSPITAL_COMMUNITY)
Admission: EM | Admit: 2018-01-20 | Discharge: 2018-01-20 | Disposition: A | Discharge status: Home / Self Care | Payer: Medicaid Other | Attending: Emergency Medicine | Admitting: Emergency Medicine

## 2018-01-20 ENCOUNTER — Other Ambulatory Visit: Payer: Self-pay

## 2018-01-20 DIAGNOSIS — F17210 Nicotine dependence, cigarettes, uncomplicated: Secondary | ICD-10-CM | POA: Diagnosis not present

## 2018-01-20 DIAGNOSIS — J40 Bronchitis, not specified as acute or chronic: Secondary | ICD-10-CM | POA: Diagnosis not present

## 2018-01-20 DIAGNOSIS — Z79899 Other long term (current) drug therapy: Secondary | ICD-10-CM | POA: Insufficient documentation

## 2018-01-20 DIAGNOSIS — R05 Cough: Secondary | ICD-10-CM | POA: Diagnosis present

## 2018-01-20 LAB — CBG MONITORING, ED: GLUCOSE-CAPILLARY: 96 mg/dL (ref 65–99)

## 2018-01-20 MED ORDER — PREDNISONE 20 MG PO TABS
60.0000 mg | ORAL_TABLET | Freq: Once | ORAL | Status: AC
  Administered 2018-01-20: 60 mg via ORAL
  Filled 2018-01-20: qty 3

## 2018-01-20 MED ORDER — ALBUTEROL SULFATE HFA 108 (90 BASE) MCG/ACT IN AERS
2.0000 | INHALATION_SPRAY | Freq: Once | RESPIRATORY_TRACT | Status: AC
  Administered 2018-01-20: 2 via RESPIRATORY_TRACT
  Filled 2018-01-20: qty 6.7

## 2018-01-20 MED ORDER — IPRATROPIUM-ALBUTEROL 0.5-2.5 (3) MG/3ML IN SOLN
3.0000 mL | Freq: Once | RESPIRATORY_TRACT | Status: AC
  Administered 2018-01-20: 3 mL via RESPIRATORY_TRACT
  Filled 2018-01-20: qty 3

## 2018-01-20 MED ORDER — PREDNISONE 20 MG PO TABS: 40.0000 mg | ORAL_TABLET | Freq: Every day | ORAL | 0 refills | Status: DC

## 2018-01-20 MED ORDER — BENZONATATE 100 MG PO CAPS: 100.0000 mg | ORAL_CAPSULE | Freq: Three times a day (TID) | ORAL | 0 refills | Status: DC

## 2018-01-20 MED ORDER — ALBUTEROL SULFATE (2.5 MG/3ML) 0.083% IN NEBU
5.0000 mg | INHALATION_SOLUTION | Freq: Once | RESPIRATORY_TRACT | Status: AC
  Administered 2018-01-20: 5 mg via RESPIRATORY_TRACT
  Filled 2018-01-20: qty 6

## 2018-01-20 MED ORDER — AEROCHAMBER PLUS FLO-VU MEDIUM MISC
1.0000 | Freq: Once | Status: AC
  Administered 2018-01-20: 1
  Filled 2018-01-20: qty 1

## 2018-01-20 NOTE — ED Triage Notes (Signed)
CBG 96. 

## 2018-01-20 NOTE — ED Provider Notes (Signed)
[facility] EMERGENCY DEPARTMENT Provider Note   CSN: [id number] Arrival date & time: 01/20/18  9562     History   Chief Complaint Chief Complaint  Patient presents with  . Shortness of Breath  . Bronchitis  . Cough  . Asthma    HPI Jenna Combs is a 27 y.o. female.  HPI Jenna Combs is a 27 y.o. female with history of asthma, presents to emergency department complaining of cough and shortness of breath.  Patient states she has had productive cough for 3 days with associated wheezing and "raspy noises" coming from her throat.  She denies any fever or chills.  No nasal congestion.  No sore throat.  She has not tried any medications at home prior to coming in.  She does have history of asthma, but states she has not had an inhaler for 2 months.  Denies exposures to any triggers, however she states she does live with her mother who has 3 cats and she states she is allergic to the cats.  She was given a breathing treatment upon arrival to emergency department which has not helped.  She states she is coughing up thick yellow sputum. States nothing making her symptoms better or worse.   Past Medical History:  Diagnosis Date  . Asthma   . Seizures (HCC)     There are no active problems to display for this patient.   Past Surgical History:  Procedure Laterality Date  . BRAIN TUMOR EXCISION     As a child     OB History   None      Home Medications    Prior to Admission medications   Medication Sig Start Date End Date Taking? Authorizing Provider  acetaminophen (TYLENOL) 500 MG tablet Take 500-1,000 mg by mouth every 6 (six) hours as needed for mild pain or moderate pain.    [provider]  albuterol (PROVENTIL HFA;VENTOLIN HFA) 108 (90 BASE) MCG/ACT inhaler Inhale 1-2 puffs into the lungs every 6 (six) hours as needed for wheezing or shortness of breath.    [provider]  cyclobenzaprine (FLEXERIL) 10 MG tablet Take 1  tablet (10 mg total) by mouth at bedtime as needed for muscle spasms. 09/13/17   Cristina Gong, PA-C  diclofenac (VOLTAREN) 50 MG EC tablet Take 1 tablet (50 mg total) by mouth 2 (two) times daily. 12/22/16   Janne Napoleon, NP  docusate sodium (COLACE) 100 MG capsule Take 1 capsule (100 mg total) by mouth every 12 (twelve) hours. 01/13/18   Kellie Shropshire, PA-C  furosemide (LASIX) 20 MG tablet Take 1 tablet (20 mg total) by mouth daily. 02/09/15   Tilden Fossa, MD  guaifenesin (ROBITUSSIN) 100 MG/5ML syrup Take 5-10 mLs (100-200 mg total) by mouth every 4 (four) hours as needed for cough. Patient not taking: Reported on 02/09/2015 09/01/13   Santiago Glad, PA-C  OVER THE COUNTER MEDICATION Take 1-2 capsules by mouth daily as needed (fluid retention). OTC fluid supplement    [provider]  polyethylene glycol (MIRALAX) packet Take 17 g by mouth daily. 01/13/18   Kellie Shropshire, PA-C  potassium chloride (K-DUR) 10 MEQ tablet Take 1 tablet (10 mEq total) by mouth daily. 02/09/15   Tilden Fossa, MD    Family History No family history on file.  Social History Social History   Tobacco Use  . Smoking status: Current Every Day Smoker    Packs/day: 0.25    Years: 0.00  Pack years: 0.00    Types: Cigarettes  . Smokeless tobacco: Never Used  Substance Use Topics  . Alcohol use: No  . Drug use: No     Allergies   Mold extract [trichophyton]   Review of Systems Review of Systems  Constitutional: Negative for chills and fever.  Respiratory: Positive for cough, chest tightness and shortness of breath.   Cardiovascular: Negative for chest pain, palpitations and leg swelling.  Gastrointestinal: Negative for abdominal pain, diarrhea, nausea and vomiting.  Genitourinary: Negative for dysuria, flank pain, pelvic pain, vaginal bleeding, vaginal discharge and vaginal pain.  Musculoskeletal: Negative for arthralgias, myalgias, neck pain and neck stiffness.  Skin:  Negative for rash.  Neurological: Negative for dizziness, weakness and headaches.  All other systems reviewed and are negative.    Physical Exam Updated Vital Signs BP 106/70 (BP Location: Right Arm)   Pulse 90   Temp 98.5 F (36.9 C) (Oral)   Resp 17   Ht 5\' 5"  (1.651 m)   Wt 104.3 kg (230 lb)   LMP 01/06/2018   BMI 38.27 kg/m   Physical Exam  Constitutional: She appears well-developed and well-nourished. No distress.  HENT:  Head: Normocephalic.  Eyes: Conjunctivae are normal.  Neck: Neck supple.  Cardiovascular: Normal rate, regular rhythm and normal heart sounds.  Pulmonary/Chest: Effort normal. No respiratory distress. She has wheezes. She has no rales.  Minimal end expiratory wheezes bilaterally  Abdominal: Soft. Bowel sounds are normal. She exhibits no distension. There is no tenderness. There is no rebound.  Musculoskeletal: She exhibits no edema.  Neurological: She is alert.  Skin: Skin is warm and dry.  Psychiatric: She has a normal mood and affect. Her behavior is normal.  Nursing note and vitals reviewed.    ED Treatments / Results  Labs (all labs ordered are listed, but only abnormal results are displayed) Labs Reviewed  CBG MONITORING, ED    EKG None  Radiology Dg Chest 2 View  Result Date: 01/20/2018 CLINICAL DATA:  Cough and shortness of breath for several days EXAM: CHEST - 2 VIEW COMPARISON:  09/01/2013 FINDINGS: The heart size and mediastinal contours are within normal limits. Both lungs are clear. The visualized skeletal structures are unremarkable. IMPRESSION: No active cardiopulmonary disease. Electronically Signed   By: Alcide CleverMark  Lukens M.D.   On: 01/20/2018 08:41    Procedures Procedures (including critical care time)  Medications Ordered in ED Medications  predniSONE (DELTASONE) tablet 60 mg (has no administration in time range)  ipratropium-albuterol (DUONEB) 0.5-2.5 (3) MG/3ML nebulizer solution 3 mL (has no administration in time  range)  albuterol (PROVENTIL) (2.5 MG/3ML) 0.083% nebulizer solution 5 mg (5 mg Nebulization Given 01/20/18 [id number])     Initial Impression / Assessment and Plan / ED Course  I have reviewed the triage vital signs and the nursing notes.  Pertinent labs & imaging results that were available during my care of the patient were reviewed by me and considered in my medical decision making (see chart for details).     Patient with cough, productive sputum, mild wheezing for 3 days.  Afebrile, nontoxic-appearing here.  Normal vital signs.  She is in no respiratory distress.  Will try another breathing treatment, patient already had one which did not help.  She is moving air well in the exam.  Will get a chest x-ray and prednisone 60 mg ordered.  Patient is concerned she might be diabetic although she has no history of such, will get CBG.  9:29 AM Xray  negative. Pt feels better. Most likely URI vs bronchitis vs bronchospasm. Wheezing resolved. Will continue prednisone. Will provide inhaler. Pt afebrile, non toxic. NAD. Stable for dc home with close outpatient follow up.   Vitals:   01/20/18 0659 01/20/18 0905  BP: 106/70 (!) 110/59  Pulse: 90 87  Resp: 17 18  Temp: 98.5 F (36.9 C)   TempSrc: Oral   SpO2:  100%  Weight: 104.3 kg (230 lb)   Height: 5\' 5"  (1.651 m)      Final Clinical Impressions(s) / ED Diagnoses   Final diagnoses:  Bronchitis    ED Discharge Orders        Ordered    benzonatate (TESSALON) 100 MG capsule  Every 8 hours     01/20/18 0931    predniSONE (DELTASONE) 20 MG tablet  Daily     01/20/18 0931       Jaynie Crumble, PA-C 01/20/18 1610    Margarita Grizzle, MD 01/21/18 1734

## 2018-01-20 NOTE — ED Notes (Signed)
Declined W/C at D/C and was escorted to lobby by RN. 

## 2018-01-20 NOTE — ED Triage Notes (Signed)
Pt. Stated, Jenna Combs had a cough, asthma, SOB for 3 days. Im suppose to have a doctor. Jenna Combs been out of my inhaler for 2 months.

## 2018-01-20 NOTE — Discharge Instructions (Addendum)
Use inhaler 2 puffs every 4 hrs. Tessalon for cough. Prednisone until all gone for inflammation. Follow up with family doctor. Return if worsening.

## 2018-05-14 ENCOUNTER — Encounter (HOSPITAL_COMMUNITY): Payer: Self-pay | Admitting: Emergency Medicine

## 2018-05-14 ENCOUNTER — Ambulatory Visit (HOSPITAL_COMMUNITY)
Admission: EM | Admit: 2018-05-14 | Discharge: 2018-05-14 | Disposition: A | Discharge status: Home / Self Care | Payer: Medicaid Other | Attending: Family Medicine | Admitting: Family Medicine

## 2018-05-14 DIAGNOSIS — J4521 Mild intermittent asthma with (acute) exacerbation: Secondary | ICD-10-CM | POA: Diagnosis not present

## 2018-05-14 MED ORDER — IPRATROPIUM BROMIDE 0.06 % NA SOLN: 2.0000 | Freq: Four times a day (QID) | NASAL | 0 refills | Status: DC

## 2018-05-14 MED ORDER — CETIRIZINE HCL 10 MG PO TABS: 10.0000 mg | ORAL_TABLET | Freq: Every day | ORAL | 0 refills | Status: DC

## 2018-05-14 MED ORDER — ALBUTEROL SULFATE HFA 108 (90 BASE) MCG/ACT IN AERS
2.0000 | INHALATION_SPRAY | Freq: Once | RESPIRATORY_TRACT | Status: AC
  Administered 2018-05-14: 2 via RESPIRATORY_TRACT

## 2018-05-14 MED ORDER — ALBUTEROL SULFATE HFA 108 (90 BASE) MCG/ACT IN AERS
INHALATION_SPRAY | RESPIRATORY_TRACT | Status: AC
  Filled 2018-05-14: qty 6.7

## 2018-05-14 MED ORDER — ALBUTEROL SULFATE (2.5 MG/3ML) 0.083% IN NEBU: 2.5000 mg | INHALATION_SOLUTION | Freq: Four times a day (QID) | RESPIRATORY_TRACT | 0 refills | Status: DC | PRN

## 2018-05-14 MED ORDER — PREDNISONE 50 MG PO TABS: 50.0000 mg | ORAL_TABLET | Freq: Every day | ORAL | 0 refills | Status: AC

## 2018-05-14 NOTE — Discharge Instructions (Addendum)
Prednisone as directed.  Albuterol inhaler as needed for shortness of breath and wheezing.  I have also refilled albuterol nebulizer solution.  Start atrovent nasal spray, zyrtec for nasal congestion/drainage. You can use over the counter nasal saline rinse such as neti pot for nasal congestion. Keep hydrated, your urine should be clear to pale yellow in color. Tylenol/motrin for fever and pain. Monitor for any worsening of symptoms, chest pain, shortness of breath, wheezing, swelling of the throat, follow up for reevaluation.   For sore throat/cough try using a honey-based tea. Use 3 teaspoons of honey with juice squeezed from half lemon. Place shaved pieces of ginger into 1/2-1 cup of water and warm over stove top. Then mix the ingredients and repeat every 4 hours as needed.

## 2018-05-14 NOTE — ED Provider Notes (Signed)
[facility]    CSN: [id number] Arrival date & time: 05/14/18  1827     History   Chief Complaint Chief Complaint  Patient presents with  . Cough  . Shortness of Breath    HPI Jenna Combs is a 27 y.o. female.   26 year old female comes in for 3 day history of asthma exacerbation. States she has been at her mother's, who has a cat, which she is allergic to and thinks that was the trigger. She has also had rhinorrhea. Denies sore throat, nasal congestion. Has nonproductive cough with wheezing, shortness of breath. Has chest tightness that improves with albuterol. However, albuterol ran out, and came in for evaluation. Current every day smoker.      Past Medical History:  Diagnosis Date  . Asthma   . Seizures (HCC)     There are no active problems to display for this patient.   Past Surgical History:  Procedure Laterality Date  . BRAIN TUMOR EXCISION     As a child    OB History   None      Home Medications    Prior to Admission medications   Medication Sig Start Date End Date Taking? Authorizing Provider  albuterol (PROVENTIL HFA;VENTOLIN HFA) 108 (90 BASE) MCG/ACT inhaler Inhale 1-2 puffs into the lungs every 6 (six) hours as needed for wheezing or shortness of breath.   Yes [provider]  acetaminophen (TYLENOL) 500 MG tablet Take 500-1,000 mg by mouth every 6 (six) hours as needed for mild pain or moderate pain.    [provider]  albuterol (PROVENTIL) (2.5 MG/3ML) 0.083% nebulizer solution Take 3 mLs (2.5 mg total) by nebulization every 6 (six) hours as needed for wheezing or shortness of breath. 05/14/18   Cathie Hoops, Tania Perrott V, PA-C  cetirizine (ZYRTEC) 10 MG tablet Take 1 tablet (10 mg total) by mouth daily. 05/14/18   Cathie Hoops, Lucetta Baehr V, PA-C  ipratropium (ATROVENT) 0.06 % nasal spray Place 2 sprays into both nostrils 4 (four) times daily. 05/14/18   Cathie Hoops, Donoven Pett V, PA-C  predniSONE (DELTASONE) 50 MG tablet Take 1 tablet (50 mg total) by mouth  daily for 5 days. 05/14/18 05/19/18  Belinda Fisher, PA-C    Family History No family history on file.  Social History Social History   Tobacco Use  . Smoking status: Current Every Day Smoker    Packs/day: 0.25    Years: 0.00    Pack years: 0.00    Types: Cigarettes  . Smokeless tobacco: Never Used  Substance Use Topics  . Alcohol use: No  . Drug use: No     Allergies   Mold extract [trichophyton]   Review of Systems Review of Systems  Reason unable to perform ROS: See HPI as above.     Physical Exam Triage Vital Signs ED Triage Vitals [05/14/18 1919]  Enc Vitals Group     BP 115/80     Pulse Rate 81     Resp 16     Temp 98.9 F (37.2 C)     Temp Source Oral     SpO2 100 %     Weight      Height      Head Circumference      Peak Flow      Pain Score 8     Pain Loc      Pain Edu?      Excl. in GC?    No data found.  Updated  Vital Signs BP 115/80   Pulse 81   Temp 98.9 F (37.2 C) (Oral)   Resp 16   LMP 04/23/2018   SpO2 100%   Physical Exam  Constitutional: She is oriented to person, place, and time. She appears well-developed and well-nourished.  Non-toxic appearance. She does not appear ill. No distress.  HENT:  Head: Normocephalic and atraumatic.  Right Ear: Tympanic membrane, external ear and ear canal normal. Tympanic membrane is not erythematous and not bulging.  Left Ear: Tympanic membrane, external ear and ear canal normal. Tympanic membrane is not erythematous and not bulging.  Nose: Nose normal. Right sinus exhibits no maxillary sinus tenderness and no frontal sinus tenderness. Left sinus exhibits no maxillary sinus tenderness and no frontal sinus tenderness.  Mouth/Throat: Uvula is midline, oropharynx is clear and moist and mucous membranes are normal.  Eyes: Pupils are equal, round, and reactive to light. Conjunctivae are normal.  Neck: Normal range of motion. Neck supple.  Cardiovascular: Normal rate, regular rhythm and normal heart sounds.  Exam reveals no gallop and no friction rub.  No murmur heard. Pulmonary/Chest: Effort normal and breath sounds normal. No accessory muscle usage. No respiratory distress. She has no decreased breath sounds. She has no wheezes. She has no rhonchi. She has no rales.  Mild tenderness to palpation of the left chest.   Lymphadenopathy:    She has no cervical adenopathy.  Neurological: She is alert and oriented to person, place, and time.  Skin: Skin is warm and dry.  Psychiatric: She has a normal mood and affect. Her behavior is normal. Judgment normal.     UC Treatments / Results  Labs (all labs ordered are listed, but only abnormal results are displayed) Labs Reviewed - No data to display  EKG None  Radiology No results found.  Procedures Procedures (including critical care time)  Medications Ordered in UC Medications  albuterol (PROVENTIL HFA;VENTOLIN HFA) 108 (90 Base) MCG/ACT inhaler 2 puff (2 puffs Inhalation Given 05/14/18 2005)    Initial Impression / Assessment and Plan / UC Course  I have reviewed the triage vital signs and the nursing notes.  Pertinent labs & imaging results that were available during my care of the patient were reviewed by me and considered in my medical decision making (see chart for details).    Patient's lungs clear to auscultation bilaterally without adventitious lung sounds.  O2 saturation at 100%.  Talking in full sentences without difficulty.  Offered DuoNeb treatment in office today, patient deferred and would like to proceed with albuterol inhaler.  Prednisone as directed.  Albuterol as needed for shortness of breath and wheezing.  Other symptomatic treatment discussed.  Return precautions given.  Patient expresses understanding and agrees to plan.  Final Clinical Impressions(s) / UC Diagnoses   Final diagnoses:  Mild intermittent asthma with exacerbation    ED Prescriptions    Medication Sig Dispense Auth. Provider   albuterol  (PROVENTIL) (2.5 MG/3ML) 0.083% nebulizer solution Take 3 mLs (2.5 mg total) by nebulization every 6 (six) hours as needed for wheezing or shortness of breath. 75 mL Deshae Dickison V, PA-C   predniSONE (DELTASONE) 50 MG tablet Take 1 tablet (50 mg total) by mouth daily for 5 days. 5 tablet Persia Lintner V, PA-C   cetirizine (ZYRTEC) 10 MG tablet Take 1 tablet (10 mg total) by mouth daily. 15 tablet Izic Stfort V, PA-C   ipratropium (ATROVENT) 0.06 % nasal spray Place 2 sprays into both nostrils 4 (four) times daily.  15 mL Threasa Alpha, [place] 05/14/18 2008

## 2018-05-14 NOTE — ED Triage Notes (Signed)
PT reports wheezing and coughing for 3 days. Ran out of her inhaler

## 2018-05-19 ENCOUNTER — Ambulatory Visit (HOSPITAL_COMMUNITY)
Admission: EM | Admit: 2018-05-19 | Discharge: 2018-05-19 | Disposition: A | Discharge status: Home / Self Care | Payer: Medicaid Other | Attending: Family Medicine | Admitting: Family Medicine

## 2018-05-19 ENCOUNTER — Encounter (HOSPITAL_COMMUNITY): Payer: Self-pay

## 2018-05-19 ENCOUNTER — Other Ambulatory Visit: Payer: Self-pay

## 2018-05-19 DIAGNOSIS — Z3202 Encounter for pregnancy test, result negative: Secondary | ICD-10-CM | POA: Diagnosis not present

## 2018-05-19 DIAGNOSIS — J45909 Unspecified asthma, uncomplicated: Secondary | ICD-10-CM | POA: Insufficient documentation

## 2018-05-19 DIAGNOSIS — N898 Other specified noninflammatory disorders of vagina: Secondary | ICD-10-CM

## 2018-05-19 DIAGNOSIS — F17210 Nicotine dependence, cigarettes, uncomplicated: Secondary | ICD-10-CM | POA: Insufficient documentation

## 2018-05-19 DIAGNOSIS — Z79899 Other long term (current) drug therapy: Secondary | ICD-10-CM | POA: Diagnosis not present

## 2018-05-19 LAB — POCT URINALYSIS DIP (DEVICE)
BILIRUBIN URINE: NEGATIVE
GLUCOSE, UA: NEGATIVE mg/dL
Ketones, ur: NEGATIVE mg/dL
NITRITE: NEGATIVE
Protein, ur: 30 mg/dL — AB
SPECIFIC GRAVITY, URINE: 1.02 (ref 1.005–1.030)
UROBILINOGEN UA: 0.2 mg/dL (ref 0.0–1.0)
pH: 7 (ref 5.0–8.0)

## 2018-05-19 LAB — POCT PREGNANCY, URINE: Preg Test, Ur: NEGATIVE

## 2018-05-19 MED ORDER — METRONIDAZOLE 500 MG PO TABS: 500.0000 mg | ORAL_TABLET | Freq: Two times a day (BID) | ORAL | 0 refills | Status: DC

## 2018-05-19 MED ORDER — FLUCONAZOLE 150 MG PO TABS: 150.0000 mg | ORAL_TABLET | Freq: Every day | ORAL | 0 refills | Status: DC

## 2018-05-19 NOTE — ED Provider Notes (Signed)
[facility]    CSN: [id number] Arrival date & time: 05/19/18  1200     History   Chief Complaint Chief Complaint  Patient presents with  . Vaginitis    HPI Jenna Combs is a 27 y.o. female.   27 year old female comes in for 2-day history of vaginal itching, odor.  States had some vaginal spotting that she thought could be due to starting cycle.  States when using wipes, can have some burning sensation to the vaginal area.  Denies urinary symptoms such as frequency, dysuria.  Hematuria given possible cycle.  Has had mild abdominal cramping that is intermittent.  Denies nausea, vomiting.  Denies fever, chills, night sweats.  Denies new hygiene product use.  Sexually active with one female partner, no condom use.      Past Medical History:  Diagnosis Date  . Asthma   . Seizures (HCC)     There are no active problems to display for this patient.   Past Surgical History:  Procedure Laterality Date  . BRAIN TUMOR EXCISION     As a child    OB History   None      Home Medications    Prior to Admission medications   Medication Sig Start Date End Date Taking? Authorizing Provider  albuterol (PROVENTIL HFA;VENTOLIN HFA) 108 (90 BASE) MCG/ACT inhaler Inhale 1-2 puffs into the lungs every 6 (six) hours as needed for wheezing or shortness of breath.   Yes [provider]  cetirizine (ZYRTEC) 10 MG tablet Take 1 tablet (10 mg total) by mouth daily. 05/14/18  Yes Donley Harland V, PA-C  predniSONE (DELTASONE) 50 MG tablet Take 1 tablet (50 mg total) by mouth daily for 5 days. 05/14/18 05/19/18 Yes Ilia Dimaano V, PA-C  acetaminophen (TYLENOL) 500 MG tablet Take 500-1,000 mg by mouth every 6 (six) hours as needed for mild pain or moderate pain.    [provider]  albuterol (PROVENTIL) (2.5 MG/3ML) 0.083% nebulizer solution Take 3 mLs (2.5 mg total) by nebulization every 6 (six) hours as needed for wheezing or shortness of breath. 05/14/18   Cathie HoopsYu, Galilea Quito V, PA-C    fluconazole (DIFLUCAN) 150 MG tablet Take 1 tablet (150 mg total) by mouth daily. Take second dose 72 hours later if symptoms still persists. 05/19/18   Cathie HoopsYu, Astra Gregg V, PA-C  ipratropium (ATROVENT) 0.06 % nasal spray Place 2 sprays into both nostrils 4 (four) times daily. 05/14/18   Cathie HoopsYu, Maury Groninger V, PA-C  metroNIDAZOLE (FLAGYL) 500 MG tablet Take 1 tablet (500 mg total) by mouth 2 (two) times daily. 05/19/18   Belinda FisherYu, Stillman Buenger V, PA-C    Family History History reviewed. No pertinent family history.  Social History Social History   Tobacco Use  . Smoking status: Current Every Day Smoker    Packs/day: 0.25    Years: 0.00    Pack years: 0.00    Types: Cigarettes  . Smokeless tobacco: Never Used  Substance Use Topics  . Alcohol use: No  . Drug use: No     Allergies   Mold extract [trichophyton]   Review of Systems Review of Systems  Reason unable to perform ROS: See HPI as above.     Physical Exam Triage Vital Signs ED Triage Vitals  Enc Vitals Group     BP 05/19/18 1330 103/69     Pulse Rate 05/19/18 1330 67     Resp 05/19/18 1330 16     Temp 05/19/18 1330 98.4 F (36.9 C)  Temp Source 05/19/18 1330 Oral     SpO2 05/19/18 1330 100 %     Weight --      Height --      Head Circumference --      Peak Flow --      Pain Score 05/19/18 1331 0     Pain Loc --      Pain Edu? --      Excl. in GC? --    No data found.  Updated Vital Signs BP 103/69 (BP Location: Left Arm)   Pulse 67   Temp 98.4 F (36.9 C) (Oral)   Resp 16   LMP 04/23/2018 (Exact Date)   SpO2 100%   Physical Exam  Constitutional: She is oriented to person, place, and time. She appears well-developed and well-nourished. No distress.  HENT:  Head: Normocephalic and atraumatic.  Eyes: Pupils are equal, round, and reactive to light. Conjunctivae are normal.  Cardiovascular: Normal rate, regular rhythm and normal heart sounds. Exam reveals no gallop and no friction rub.  No murmur heard. Pulmonary/Chest: Effort  normal and breath sounds normal. No stridor. No respiratory distress. She has no wheezes. She has no rales.  Abdominal: Soft. Bowel sounds are normal. She exhibits no mass. There is no tenderness. There is no rebound, no guarding and no CVA tenderness.  Neurological: She is alert and oriented to person, place, and time.  Skin: Skin is warm and dry.  Psychiatric: She has a normal mood and affect. Her behavior is normal. Judgment normal.   UC Treatments / Results  Labs (all labs ordered are listed, but only abnormal results are displayed) Labs Reviewed  POCT URINALYSIS DIP (DEVICE) - Abnormal; Notable for the following components:      Result Value   Hgb urine dipstick LARGE (*)    Protein, ur 30 (*)    Leukocytes, UA SMALL (*)    All other components within normal limits  URINE CULTURE  POCT PREGNANCY, URINE  CERVICOVAGINAL ANCILLARY ONLY    EKG None  Radiology No results found.  Procedures Procedures (including critical care time)  Medications Ordered in UC Medications - No data to display  Initial Impression / Assessment and Plan / UC Course  I have reviewed the triage vital signs and the nursing notes.  Pertinent labs & imaging results that were available during my care of the patient were reviewed by me and considered in my medical decision making (see chart for details).    Urine dipstick with small leuks, symptoms and consistent with UTI, will send for urine culture.  Patient treated empirically for BV and yeast.  Flagyl and Diflucan as directed.  Cytology sent, patient will be contacted with any positive results that require additional treatment. Patient to refrain from sexual activity for the next 7 days. Return precautions given.   Final Clinical Impressions(s) / UC Diagnoses   Final diagnoses:  Vaginal discharge    ED Prescriptions    Medication Sig Dispense Auth. Provider   metroNIDAZOLE (FLAGYL) 500 MG tablet Take 1 tablet (500 mg total) by mouth 2 (two)  times daily. 14 tablet Keiran Gaffey V, PA-C   fluconazole (DIFLUCAN) 150 MG tablet Take 1 tablet (150 mg total) by mouth daily. Take second dose 72 hours later if symptoms still persists. 2 tablet Threasa Alpha, PA-C 05/19/18 1410

## 2018-05-19 NOTE — Discharge Instructions (Signed)
Your symptoms does not quite match UTI, urine with some bacteria, it is sent for urine culture for further testing. Your urine pregnancy test was negative. You were treated empirically for bacterial vaginitis and yeast. Start flagyl and diflucan. Cytology sent, you will be contacted with any positive results that requires further treatment. Refrain from sexual activity and alcohol use for the next 7 days. Monitor for any worsening of symptoms, fever, abdominal pain, nausea, vomiting, to follow up for reevaluation.

## 2018-05-19 NOTE — ED Triage Notes (Signed)
[facility] since last night, pt complains of spotting, odor,burning, and itching.

## 2018-05-20 LAB — URINE CULTURE: Culture: NO GROWTH

## 2018-05-21 LAB — CERVICOVAGINAL ANCILLARY ONLY
BACTERIAL VAGINITIS: POSITIVE — AB
CANDIDA VAGINITIS: NEGATIVE
Chlamydia: NEGATIVE
Neisseria Gonorrhea: NEGATIVE
TRICH (WINDOWPATH): NEGATIVE

## 2018-05-22 ENCOUNTER — Telehealth (HOSPITAL_COMMUNITY): Payer: Self-pay

## 2018-05-22 NOTE — Telephone Encounter (Signed)
Bacterial Vaginosis test is positive.  Prescription for metronidazole was given at the urgent care visit.  

## 2018-06-01 ENCOUNTER — Ambulatory Visit (HOSPITAL_COMMUNITY)
Admission: EM | Admit: 2018-06-01 | Discharge: 2018-06-01 | Disposition: A | Discharge status: Home / Self Care | Payer: Medicaid Other | Attending: Family Medicine | Admitting: Family Medicine

## 2018-06-01 ENCOUNTER — Encounter (HOSPITAL_COMMUNITY): Payer: Self-pay

## 2018-06-01 DIAGNOSIS — Z760 Encounter for issue of repeat prescription: Secondary | ICD-10-CM

## 2018-06-01 DIAGNOSIS — J45909 Unspecified asthma, uncomplicated: Secondary | ICD-10-CM

## 2018-06-01 MED ORDER — ALBUTEROL SULFATE HFA 108 (90 BASE) MCG/ACT IN AERS: 1.0000 | INHALATION_SPRAY | Freq: Four times a day (QID) | RESPIRATORY_TRACT | 2 refills | Status: DC | PRN

## 2018-06-01 NOTE — Discharge Instructions (Signed)
It was nice meeting you!!  We refilled your inhaler.  Follow up as needed.

## 2018-06-01 NOTE — ED Provider Notes (Signed)
[facility]    CSN: [id number] Arrival date & time: 06/01/18  1115     History   Chief Complaint Chief Complaint  Patient presents with  . Medication Refill    Inhaler (albuterol)    HPI Jenna Combs is a 27 y.o. female.   Pt is a 27 year old female that presents for inhaler refill. She reports that she lost her inhaler. She has a nebulizer machine at home and used that this am. She denies any cough, congestion or recent illness. She denies any fever, chills. She denies any chest pain. She has had asthma since a child. She is not on any maintenance  inhalers.   She does smoke. She works outside in the heat.   ROS per HPI      Past Medical History:  Diagnosis Date  . Asthma   . Seizures (HCC)     There are no active problems to display for this patient.   Past Surgical History:  Procedure Laterality Date  . BRAIN TUMOR EXCISION     As a child    OB History   None      Home Medications    Prior to Admission medications   Medication Sig Start Date End Date Taking? Authorizing Provider  acetaminophen (TYLENOL) 500 MG tablet Take 500-1,000 mg by mouth every 6 (six) hours as needed for mild pain or moderate pain.    [provider]  albuterol (PROVENTIL HFA;VENTOLIN HFA) 108 (90 Base) MCG/ACT inhaler Inhale 1-2 puffs into the lungs every 6 (six) hours as needed for wheezing or shortness of breath. 06/01/18   Dahlia ByesBast, Pocahontas Cohenour A, NP  cetirizine (ZYRTEC) 10 MG tablet Take 1 tablet (10 mg total) by mouth daily. 05/14/18   Cathie HoopsYu, Amy V, PA-C  fluconazole (DIFLUCAN) 150 MG tablet Take 1 tablet (150 mg total) by mouth daily. Take second dose 72 hours later if symptoms still persists. 05/19/18   Cathie HoopsYu, Amy V, PA-C  ipratropium (ATROVENT) 0.06 % nasal spray Place 2 sprays into both nostrils 4 (four) times daily. 05/14/18   Cathie HoopsYu, Amy V, PA-C  metroNIDAZOLE (FLAGYL) 500 MG tablet Take 1 tablet (500 mg total) by mouth 2 (two) times daily. 05/19/18   Belinda FisherYu, Amy V, PA-C      Family History History reviewed. No pertinent family history.  Social History Social History   Tobacco Use  . Smoking status: Current Every Day Smoker    Packs/day: 0.25    Years: 0.00    Pack years: 0.00    Types: Cigarettes  . Smokeless tobacco: Never Used  Substance Use Topics  . Alcohol use: No  . Drug use: No     Allergies   Mold extract [trichophyton]   Review of Systems Review of Systems   Physical Exam Triage Vital Signs ED Triage Vitals  Enc Vitals Group     BP 06/01/18 1135 109/87     Pulse --      Resp 06/01/18 1130 20     Temp 06/01/18 1130 98.3 F (36.8 C)     Temp Source 06/01/18 1130 Oral     SpO2 --      Weight --      Height --      Head Circumference --      Peak Flow --      Pain Score 06/01/18 1127 1     Pain Loc --      Pain Edu? --      Excl.  in GC? --    No data found.  Updated Vital Signs BP 109/87 (BP Location: Right Arm)   Temp 98.3 F (36.8 C) (Oral)   Resp 20   LMP 05/19/2018   Visual Acuity Right Eye Distance:   Left Eye Distance:   Bilateral Distance:    Right Eye Near:   Left Eye Near:    Bilateral Near:     Physical Exam  Constitutional: She is oriented to person, place, and time. She appears well-developed and well-nourished.  HENT:  Head: Normocephalic and atraumatic.  Eyes: Pupils are equal, round, and reactive to light.  Neck: Normal range of motion.  Cardiovascular: Normal rate and regular rhythm.  Pulmonary/Chest: Effort normal.  Lung clear, no wheezing. Speaking in full sentences.   Musculoskeletal: Normal range of motion.  Neurological: She is alert and oriented to person, place, and time.  Skin: Skin is warm and dry. Capillary refill takes less than 2 seconds.  Psychiatric: She has a normal mood and affect.  Nursing note and vitals reviewed.    UC Treatments / Results  Labs (all labs ordered are listed, but only abnormal results are displayed) Labs Reviewed - No data to  display  EKG None  Radiology No results found.  Procedures Procedures (including critical care time)  Medications Ordered in UC Medications - No data to display  Initial Impression / Assessment and Plan / UC Course  I have reviewed the triage vital signs and the nursing notes.  Pertinent labs & imaging results that were available during my care of the patient were reviewed by me and considered in my medical decision making (see chart for details).     Exam benign. Refilled her albuterol inhaler. Follow up as needed.   Final Clinical Impressions(s) / UC Diagnoses   Final diagnoses:  Medication refill     Discharge Instructions     It was nice meeting you!!  We refilled your inhaler.  Follow up as needed.     ED Prescriptions    Medication Sig Dispense Auth. Provider   albuterol (PROVENTIL HFA;VENTOLIN HFA) 108 (90 Base) MCG/ACT inhaler Inhale 1-2 puffs into the lungs every 6 (six) hours as needed for wheezing or shortness of breath. 1 Inhaler Dahlia ByesBast, Oak Dorey A, NP     Controlled Substance Prescriptions [place] Controlled Substance Registry consulted? Not Applicable   Janace ArisBast, Kenadi Miltner A, NP 06/01/18 1222

## 2018-06-01 NOTE — ED Triage Notes (Signed)
Pt presents to get her refill on inhaler because she lost it and got short of breath this morning running  to her car

## 2018-06-24 ENCOUNTER — Emergency Department (HOSPITAL_COMMUNITY): Payer: Medicaid Other

## 2018-06-24 ENCOUNTER — Encounter (HOSPITAL_COMMUNITY): Payer: Self-pay | Admitting: *Deleted

## 2018-06-24 ENCOUNTER — Emergency Department (HOSPITAL_COMMUNITY)
Admission: EM | Admit: 2018-06-24 | Discharge: 2018-06-24 | Disposition: A | Discharge status: Home / Self Care | Payer: Medicaid Other | Attending: Emergency Medicine | Admitting: Emergency Medicine

## 2018-06-24 ENCOUNTER — Other Ambulatory Visit: Payer: Self-pay

## 2018-06-24 DIAGNOSIS — R0602 Shortness of breath: Secondary | ICD-10-CM | POA: Diagnosis present

## 2018-06-24 DIAGNOSIS — R05 Cough: Secondary | ICD-10-CM | POA: Diagnosis not present

## 2018-06-24 DIAGNOSIS — F17210 Nicotine dependence, cigarettes, uncomplicated: Secondary | ICD-10-CM | POA: Diagnosis not present

## 2018-06-24 DIAGNOSIS — Z79899 Other long term (current) drug therapy: Secondary | ICD-10-CM | POA: Insufficient documentation

## 2018-06-24 DIAGNOSIS — J4521 Mild intermittent asthma with (acute) exacerbation: Secondary | ICD-10-CM

## 2018-06-24 LAB — CBC
HCT: 37.6 % (ref 36.0–46.0)
Hemoglobin: 12.3 g/dL (ref 12.0–15.0)
MCH: 30.6 pg (ref 26.0–34.0)
MCHC: 32.7 g/dL (ref 30.0–36.0)
MCV: 93.5 fL (ref 78.0–100.0)
PLATELETS: 295 10*3/uL (ref 150–400)
RBC: 4.02 MIL/uL (ref 3.87–5.11)
RDW: 13.4 % (ref 11.5–15.5)
WBC: 7.3 10*3/uL (ref 4.0–10.5)

## 2018-06-24 LAB — BASIC METABOLIC PANEL
Anion gap: 11 (ref 5–15)
BUN: 8 mg/dL (ref 6–20)
CHLORIDE: 108 mmol/L (ref 98–111)
CO2: 23 mmol/L (ref 22–32)
CREATININE: 0.89 mg/dL (ref 0.44–1.00)
Calcium: 9.4 mg/dL (ref 8.9–10.3)
GFR calc Af Amer: >60 mL/min (ref >60)
GFR calc non Af Amer: >60 mL/min (ref >60)
GLUCOSE: 109 mg/dL — AB (ref 70–99)
Potassium: 3.6 mmol/L (ref 3.5–5.1)
SODIUM: 142 mmol/L (ref 135–145)

## 2018-06-24 LAB — TROPONIN I: Troponin I: <0.03 ng/mL (ref <0.03)

## 2018-06-24 MED ORDER — IPRATROPIUM-ALBUTEROL 0.5-2.5 (3) MG/3ML IN SOLN
3.0000 mL | Freq: Once | RESPIRATORY_TRACT | Status: AC
  Administered 2018-06-24: 3 mL via RESPIRATORY_TRACT
  Filled 2018-06-24: qty 3

## 2018-06-24 MED ORDER — ALBUTEROL SULFATE HFA 108 (90 BASE) MCG/ACT IN AERS: 2.0000 | INHALATION_SPRAY | RESPIRATORY_TRACT | 0 refills | Status: DC | PRN

## 2018-06-24 MED ORDER — DEXAMETHASONE SODIUM PHOSPHATE 10 MG/ML IJ SOLN
10.0000 mg | Freq: Once | INTRAMUSCULAR | Status: AC
  Administered 2018-06-24: 10 mg via INTRAMUSCULAR
  Filled 2018-06-24: qty 1

## 2018-06-24 MED ORDER — ALBUTEROL SULFATE HFA 108 (90 BASE) MCG/ACT IN AERS
2.0000 | INHALATION_SPRAY | RESPIRATORY_TRACT | Status: DC | PRN
  Filled 2018-06-24: qty 6.7

## 2018-06-24 NOTE — ED Triage Notes (Signed)
The pt is c/o sob for 2-3 days cold cough no temp   Hx asthma.  Hyperventilating at present   Chest pain  Worse with movement lmp  yesterday

## 2018-06-24 NOTE — ED Provider Notes (Signed)
[facility] EMERGENCY DEPARTMENT Provider Note   CSN: [id number] Arrival date & time: 06/24/18  0043     History   Chief Complaint Chief Complaint  Patient presents with  . Shortness of Breath    HPI Jenna Combs is a 27 y.o. female.  HPI  This is a 27 year old female with a history of asthma who presents with cough and shortness of breath.  Patient reports several days of worsening shortness of breath.  Reports nonproductive cough.  She states that she started out with a sore throat and feels like it moved into her chest.  Denies fevers or chills.  She has been using her albuterol inhaler with minimal relief.  She has taken over-the-counter medications with minimal relief including NyQuil.  Patient reports today she developed anterior chest pain that is nonradiating and is concerned her.  Denies any nausea, vomiting, abdominal pain.  Past Medical History:  Diagnosis Date  . Asthma   . Seizures (HCC)     There are no active problems to display for this patient.   Past Surgical History:  Procedure Laterality Date  . BRAIN TUMOR EXCISION     As a child     OB History   None      Home Medications    Prior to Admission medications   Medication Sig Start Date End Date Taking? Authorizing Provider  acetaminophen (TYLENOL) 500 MG tablet Take 500-1,000 mg by mouth every 6 (six) hours as needed for mild pain or moderate pain.   Yes [provider]  albuterol (PROVENTIL HFA;VENTOLIN HFA) 108 (90 Base) MCG/ACT inhaler Inhale 1-2 puffs into the lungs every 6 (six) hours as needed for wheezing or shortness of breath. 06/01/18  Yes Bast, Traci A, NP  albuterol (PROVENTIL HFA;VENTOLIN HFA) 108 (90 Base) MCG/ACT inhaler Inhale 2 puffs into the lungs every 4 (four) hours as needed for wheezing or shortness of breath. 06/24/18   Shanique Aslinger, Mayer Masker, MD  cetirizine (ZYRTEC) 10 MG tablet Take 1 tablet (10 mg total) by mouth daily. Patient not taking:  Reported on 06/24/2018 05/14/18   Belinda Fisher, PA-C  ipratropium (ATROVENT) 0.06 % nasal spray Place 2 sprays into both nostrils 4 (four) times daily. Patient not taking: Reported on 06/24/2018 05/14/18   Lurline Idol    Family History No family history on file.  Social History Social History   Tobacco Use  . Smoking status: Current Every Day Smoker    Packs/day: 0.25    Years: 0.00    Pack years: 0.00    Types: Cigarettes  . Smokeless tobacco: Never Used  Substance Use Topics  . Alcohol use: No  . Drug use: No     Allergies   Mold extract [trichophyton]   Review of Systems Review of Systems  Constitutional: Negative for fever.  HENT: Positive for sore throat.   Respiratory: Positive for cough, chest tightness and shortness of breath.   Cardiovascular: Negative for chest pain and leg swelling.  Gastrointestinal: Negative for abdominal pain, nausea and vomiting.  Genitourinary: Negative for dysuria.  All other systems reviewed and are negative.    Physical Exam Updated Vital Signs BP 111/74   Pulse (!) 105   Temp 98.2 F (36.8 C) (Oral)   Resp (!) 22   Ht 1.626 m (5\' 4" )   Wt 90.7 kg   LMP 06/23/2018   SpO2 97%   BMI 34.33 kg/m   Physical Exam  Constitutional: She is oriented  to person, place, and time. She appears well-developed and well-nourished.  HENT:  Head: Normocephalic and atraumatic.  Mouth/Throat: Oropharynx is clear and moist.  Uvula midline, no tonsillar swelling or exudate noted  Eyes: Pupils are equal, round, and reactive to light.  Neck: Normal range of motion. Neck supple.  Cardiovascular: Normal rate, regular rhythm and normal heart sounds.  Pulmonary/Chest: Effort normal. No accessory muscle usage. No respiratory distress. She has wheezes.  Expiratory wheezing in all lung fields, most prominent right upper lobe  Abdominal: Soft. Bowel sounds are normal.  Musculoskeletal:       Right lower leg: She exhibits no edema.       Left lower leg:  She exhibits no edema.  Neurological: She is alert and oriented to person, place, and time.  Skin: Skin is warm and dry.  Psychiatric: She has a normal mood and affect.  Nursing note and vitals reviewed.    ED Treatments / Results  Labs (all labs ordered are listed, but only abnormal results are displayed) Labs Reviewed  BASIC METABOLIC PANEL - Abnormal; Notable for the following components:      Result Value   Glucose, Bld 109 (*)    All other components within normal limits  CBC  TROPONIN I    EKG EKG Interpretation  Date/Time:  Sunday June 24 2018 01:01:00 EDT Ventricular Rate:  96 PR Interval:  230 QRS Duration: 90 QT Interval:  358 QTC Calculation: 452 R Axis:   75 Text Interpretation:  Sinus rhythm with sinus arrhythmia with 1st degree A-V block Nonspecific T wave abnormality Abnormal ECG Confirmed by Ross Marcus ([id number]) on 06/24/2018 1:37:44 AM   Radiology Dg Chest 2 View  Result Date: 06/24/2018 CLINICAL DATA:  2-3 days of cold with cough.  No fever.  Dyspnea. EXAM: CHEST - 2 VIEW COMPARISON:  01/20/2018 FINDINGS: The heart size and mediastinal contours are within normal limits. Both lungs are clear. The visualized skeletal structures are unremarkable. IMPRESSION: No active cardiopulmonary disease. Electronically Signed   By: Tollie Eth M.D.   On: 06/24/2018 02:50    Procedures Procedures (including critical care time)  Medications Ordered in ED Medications  albuterol (PROVENTIL HFA;VENTOLIN HFA) 108 (90 Base) MCG/ACT inhaler 2 puff (has no administration in time range)  ipratropium-albuterol (DUONEB) 0.5-2.5 (3) MG/3ML nebulizer solution 3 mL (3 mLs Nebulization Given 06/24/18 0327)  dexamethasone (DECADRON) injection 10 mg (10 mg Intramuscular Given 06/24/18 0353)  ipratropium-albuterol (DUONEB) 0.5-2.5 (3) MG/3ML nebulizer solution 3 mL (3 mLs Nebulization Given 06/24/18 0323)     Initial Impression / Assessment and Plan / ED Course  I have reviewed  the triage vital signs and the nursing notes.  Pertinent labs & imaging results that were available during my care of the patient were reviewed by me and considered in my medical decision making (see chart for details).  Clinical Course as of Jun 24 510  Wynelle Link Jun 24, 2018  0317 Comfortably but continues to have some wheeze.  Repeat DuoNeb ordered.   [CH]    Clinical Course User Index [CH] Reyan Helle, Mayer Masker, MD    Patient presents with wheezing and recent upper respiratory symptoms.  She is overall nontoxic.  Afebrile.  No acute respiratory distress.  She is wheezing on exam.  Oropharyngeal exam is reassuring.  Patient was given IM Decadron and a DuoNeb.  On recheck, she is resting comfortably but does continue to have some wheeze.  O2 sats are 97%.  Repeat DuoNeb ordered.  After second  DuoNeb, wheezing mostly improved with some mild persistence specifically in the right upper lobe.  Patient is nontoxic-appearing.  She is able to ambulate without difficulty.  Will discharge home with an inhaler and symptom control.  After history, exam, and medical workup I feel the patient has been appropriately medically screened and is safe for discharge home. Pertinent diagnoses were discussed with the patient. Patient was given return precautions.   Final Clinical Impressions(s) / ED Diagnoses   Final diagnoses:  Mild intermittent asthma with exacerbation    ED Discharge Orders         Ordered    albuterol (PROVENTIL HFA;VENTOLIN HFA) 108 (90 Base) MCG/ACT inhaler  Every 4 hours PRN     06/24/18 0510           Shon Baton, MD 06/24/18 [phone]

## 2018-06-24 NOTE — ED Notes (Signed)
Pt able to ambulate without assistance. Pt reports feeling winded towards the end and feeling achy in her chest and back. Pt respirations increased from 18 to 22 while ambulating.

## 2018-08-12 IMAGING — CR DG ABDOMEN 1V
2 series · 2 of 2 positions shown · non-contrast
Comparison: None.

CLINICAL DATA: Constipation, acute upper abdominal pain.

EXAM:
ABDOMEN - 1 VIEW

[t abdomen supine (1 of 2)]
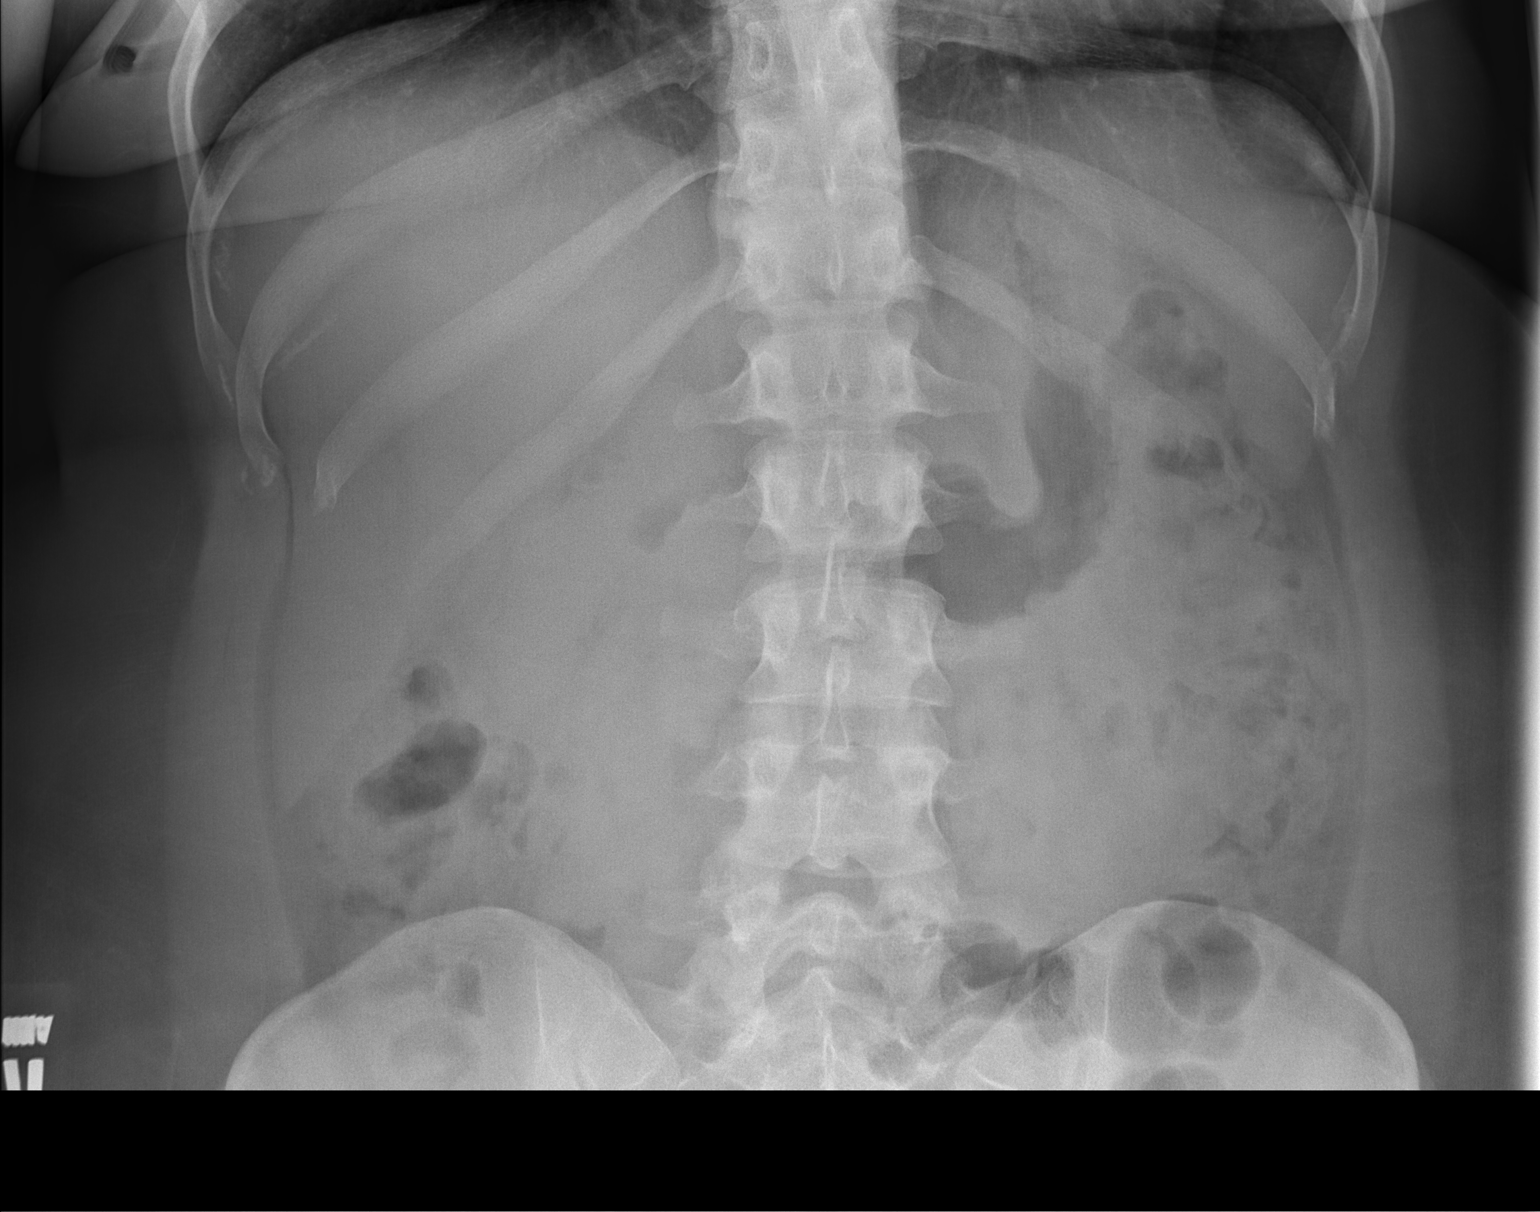

[t abdomen supine (2 of 2)]
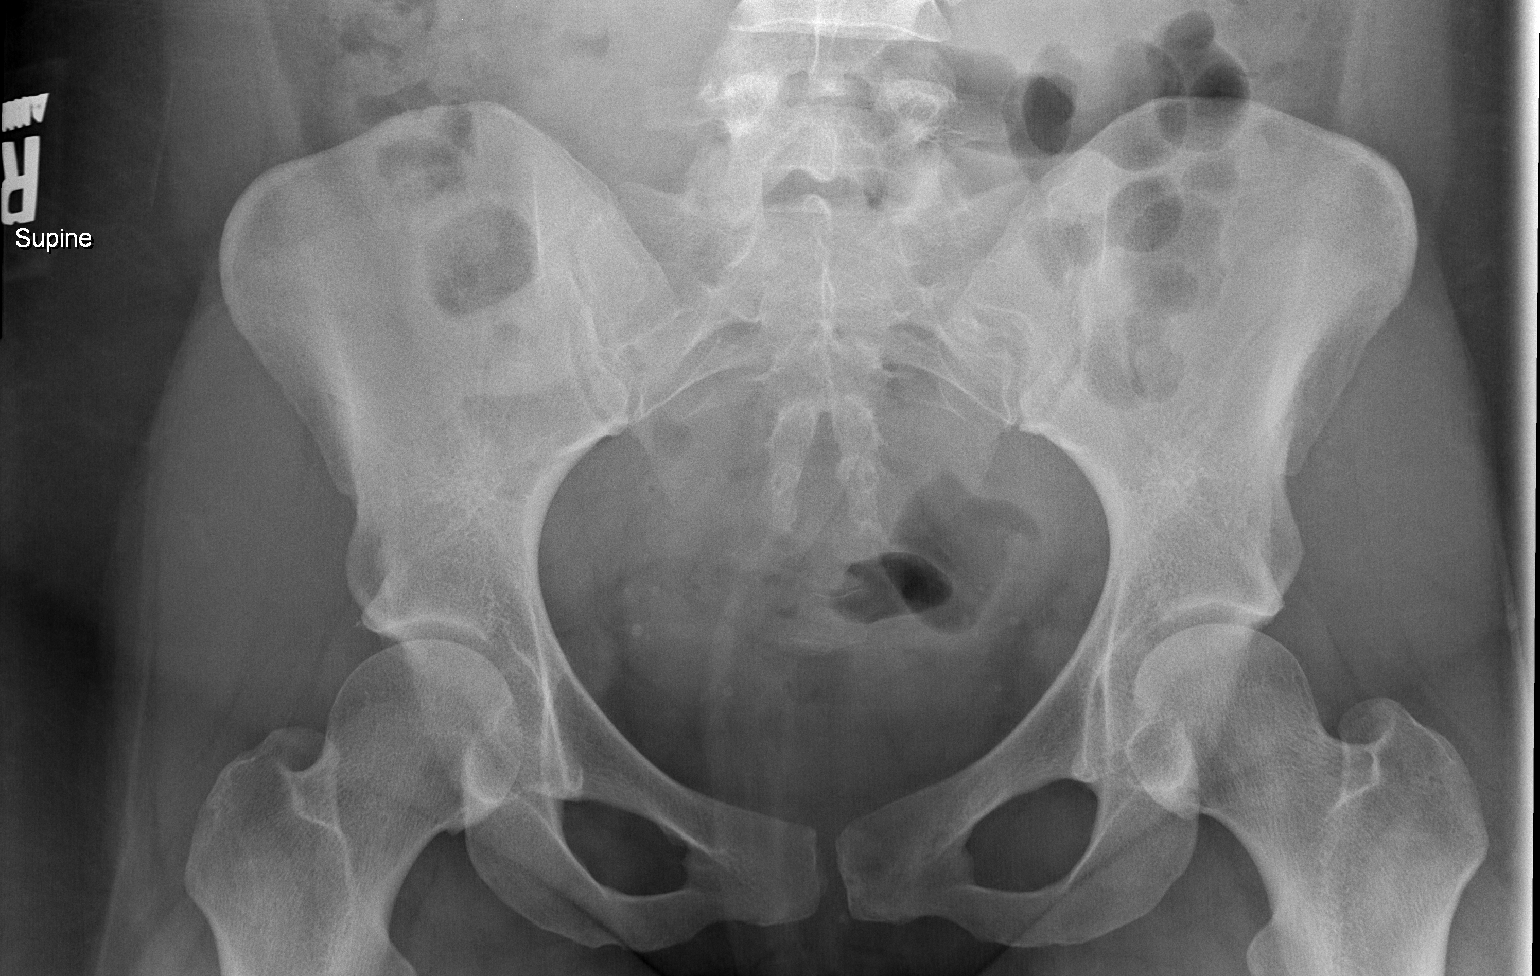

[2 of 2 positions shown; findings below may reference images not displayed]

FINDINGS: No abnormal bowel dilatation is noted. Phleboliths are noted in the
pelvis. Moderate amount of stool seen throughout the colon.
IMPRESSION: Moderate stool burden is noted. No evidence of bowel obstruction or
ileus.

## 2018-08-19 IMAGING — DX DG CHEST 2V
2 series · 2 of 2 positions shown · non-contrast
Comparison: 09/01/2013

CLINICAL DATA: Cough and shortness of breath for several days

EXAM:
CHEST - 2 VIEW

[chest pa]
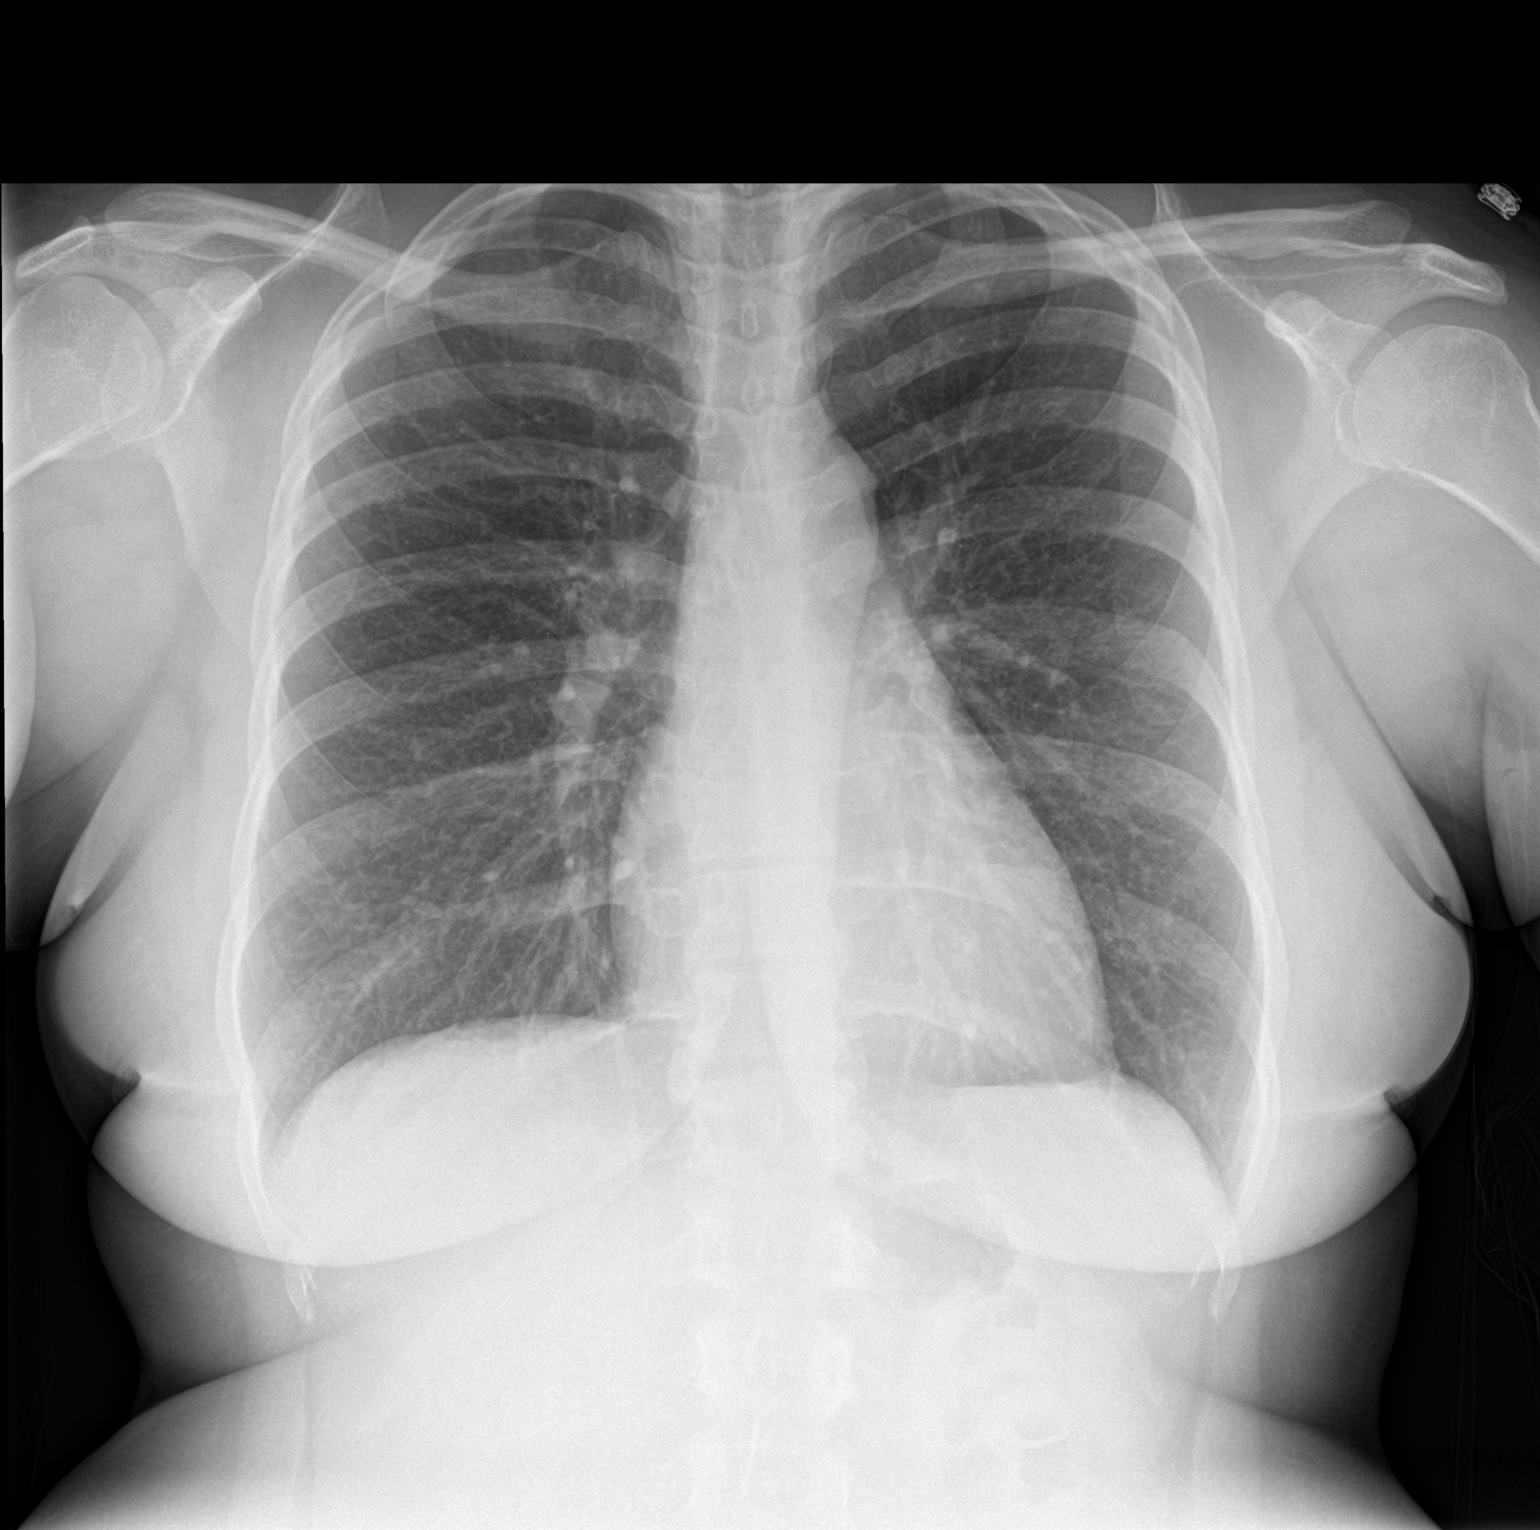

[chest lat]
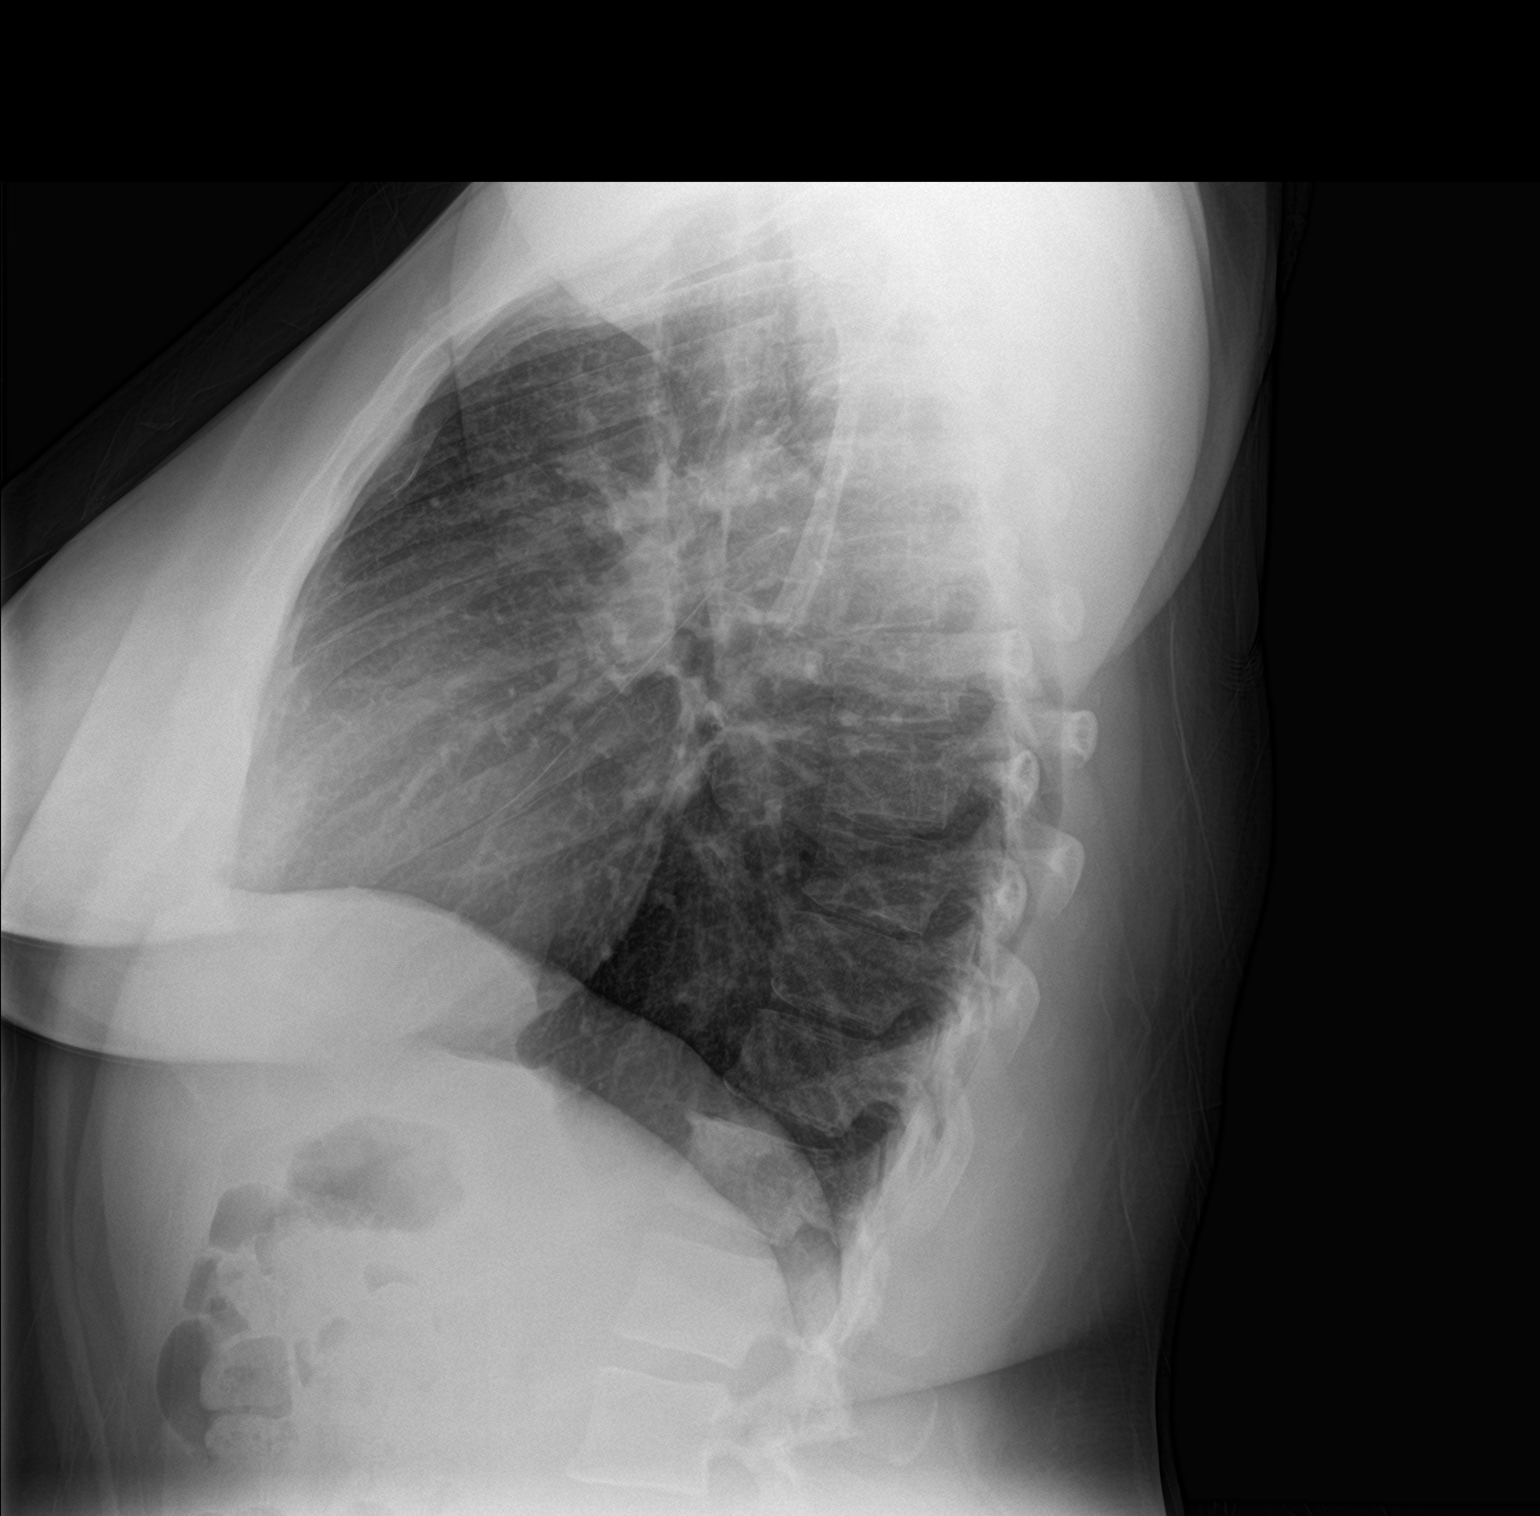

[2 of 2 positions shown; findings below may reference images not displayed]

FINDINGS: The heart size and mediastinal contours are within normal limits.
Both lungs are clear. The visualized skeletal structures are
unremarkable.
IMPRESSION: No active cardiopulmonary disease.

## 2018-09-25 ENCOUNTER — Ambulatory Visit (HOSPITAL_COMMUNITY)
Admission: EM | Admit: 2018-09-25 | Discharge: 2018-09-25 | Disposition: A | Discharge status: Home / Self Care | Payer: Medicaid Other | Attending: Family Medicine | Admitting: Family Medicine

## 2018-09-25 ENCOUNTER — Encounter (HOSPITAL_COMMUNITY): Payer: Self-pay | Admitting: Emergency Medicine

## 2018-09-25 DIAGNOSIS — J4521 Mild intermittent asthma with (acute) exacerbation: Secondary | ICD-10-CM | POA: Diagnosis not present

## 2018-09-25 DIAGNOSIS — R0603 Acute respiratory distress: Secondary | ICD-10-CM | POA: Insufficient documentation

## 2018-09-25 DIAGNOSIS — J4520 Mild intermittent asthma, uncomplicated: Secondary | ICD-10-CM

## 2018-09-25 MED ORDER — PREDNISONE 20 MG PO TABS: 20.0000 mg | ORAL_TABLET | Freq: Two times a day (BID) | ORAL | 0 refills | Status: DC

## 2018-09-25 MED ORDER — ALBUTEROL SULFATE (2.5 MG/3ML) 0.083% IN NEBU
2.5000 mg | INHALATION_SOLUTION | Freq: Once | RESPIRATORY_TRACT | Status: AC
  Administered 2018-09-25: 2.5 mg via RESPIRATORY_TRACT

## 2018-09-25 MED ORDER — ALBUTEROL SULFATE HFA 108 (90 BASE) MCG/ACT IN AERS: 1.0000 | INHALATION_SPRAY | Freq: Four times a day (QID) | RESPIRATORY_TRACT | 1 refills | Status: DC | PRN

## 2018-09-25 MED ORDER — ALBUTEROL SULFATE (2.5 MG/3ML) 0.083% IN NEBU: 2.5000 mg | INHALATION_SOLUTION | Freq: Four times a day (QID) | RESPIRATORY_TRACT | 1 refills | Status: DC | PRN

## 2018-09-25 MED ORDER — IPRATROPIUM-ALBUTEROL 0.5-2.5 (3) MG/3ML IN SOLN
3.0000 mL | Freq: Once | RESPIRATORY_TRACT | Status: AC
  Administered 2018-09-25: 3 mL via RESPIRATORY_TRACT

## 2018-09-25 MED ORDER — METHYLPREDNISOLONE SODIUM SUCC 125 MG IJ SOLR
125.0000 mg | Freq: Once | INTRAMUSCULAR | Status: AC
  Administered 2018-09-25: 125 mg via INTRAMUSCULAR

## 2018-09-25 MED ORDER — FAMOTIDINE 20 MG PO TABS: 20.0000 mg | ORAL_TABLET | Freq: Two times a day (BID) | ORAL | 0 refills | Status: DC

## 2018-09-25 MED ORDER — ALBUTEROL SULFATE (2.5 MG/3ML) 0.083% IN NEBU
INHALATION_SOLUTION | RESPIRATORY_TRACT | Status: AC
  Filled 2018-09-25: qty 3

## 2018-09-25 MED ORDER — METHYLPREDNISOLONE SODIUM SUCC 125 MG IJ SOLR
INTRAMUSCULAR | Status: AC
  Filled 2018-09-25: qty 2

## 2018-09-25 MED ORDER — IPRATROPIUM-ALBUTEROL 0.5-2.5 (3) MG/3ML IN SOLN
RESPIRATORY_TRACT | Status: AC
  Filled 2018-09-25: qty 3

## 2018-09-25 NOTE — ED Provider Notes (Signed)
[facility]    CSN: [id number] Arrival date & time: 09/25/18  0800     History   Chief Complaint Chief Complaint  Patient presents with  . Shortness of Breath  . Asthma    HPI Jenna Combs is a 27 y.o. female.   HPI  Patient has known asthma.  She has intermittent asthma.  She woke up this morning acutely short of breath.  She is out of her inhaler and filled food for her nebulizer.  She is here for treatment, and refills.  No cough cold or runny nose.  No recent fever.  No sputum production.  No chest pain.  She does has acute shortness of breath.  Unknown trigger.  She does have some environmental allergies.  Past Medical History:  Diagnosis Date  . Asthma   . Seizures [facility])     Patient Active Problem List   Diagnosis Date Noted  . Intermittent asthma without complication 09/25/2018    Past Surgical History:  Procedure Laterality Date  . BRAIN TUMOR EXCISION     As a child    OB History   None      Home Medications    Prior to Admission medications   Medication Sig Start Date End Date Taking? Authorizing Provider  acetaminophen (TYLENOL) 500 MG tablet Take 500-1,000 mg by mouth every 6 (six) hours as needed for mild pain or moderate pain.    [provider]  albuterol (PROVENTIL HFA;VENTOLIN HFA) 108 (90 Base) MCG/ACT inhaler Inhale 1-2 puffs into the lungs every 6 (six) hours as needed for wheezing or shortness of breath. 09/25/18   Eustace Moore, MD  albuterol (PROVENTIL) (2.5 MG/3ML) 0.083% nebulizer solution Take 3 mLs (2.5 mg total) by nebulization every 6 (six) hours as needed for wheezing or shortness of breath. 09/25/18   Eustace Moore, MD  cetirizine (ZYRTEC) 10 MG tablet Take 1 tablet (10 mg total) by mouth daily. Patient not taking: Reported on 06/24/2018 05/14/18   Belinda Fisher, PA-C  ipratropium (ATROVENT) 0.06 % nasal spray Place 2 sprays into both nostrils 4 (four) times daily. Patient not taking: Reported on  06/24/2018 05/14/18   Lurline Idol    Family History No family history on file.  Social History Social History   Tobacco Use  . Smoking status: Current Every Day Smoker    Packs/day: 0.25    Years: 0.00    Pack years: 0.00    Types: Cigarettes  . Smokeless tobacco: Never Used  Substance Use Topics  . Alcohol use: No  . Drug use: No     Allergies   Mold extract [trichophyton]   Review of Systems Review of Systems  Constitutional: Negative for chills and fever.  HENT: Negative for ear pain and sore throat.   Eyes: Negative for pain and visual disturbance.  Respiratory: Positive for cough, shortness of breath and wheezing.   Cardiovascular: Negative for chest pain and palpitations.  Gastrointestinal: Negative for abdominal pain and vomiting.  Genitourinary: Negative for dysuria and hematuria.  Musculoskeletal: Negative for arthralgias and back pain.  Skin: Negative for color change and rash.  Neurological: Negative for seizures and syncope.  All other systems reviewed and are negative.    Physical Exam Triage Vital Signs ED Triage Vitals  Enc Vitals Group     BP 09/25/18 0816 115/90     Pulse Rate 09/25/18 0816 96     Resp 09/25/18 0816 20     Temp  09/25/18 0816 98 F (36.7 C)     Temp Source 09/25/18 0816 Oral     SpO2 09/25/18 0816 95 %     Weight --      Height --      Head Circumference --      Peak Flow --      Pain Score 09/25/18 0820 6     Pain Loc --      Pain Edu? --      Excl. in GC? --    No data found.  Updated Vital Signs BP 115/90 (BP Location: Left Arm)   Pulse 96   Temp 98 F (36.7 C) (Oral)   Resp 20   LMP 09/07/2018   SpO2 95%   Visual Acuity Right Eye Distance:   Left Eye Distance:   Bilateral Distance:    Right Eye Near:   Left Eye Near:    Bilateral Near:     Physical Exam  Constitutional: She appears well-developed and well-nourished. She appears ill. No distress.  HENT:  Head: Normocephalic and atraumatic.    Mouth/Throat: Oropharynx is clear and moist.  Eyes: Pupils are equal, round, and reactive to light. Conjunctivae are normal.  Neck: Normal range of motion.  Cardiovascular: Normal rate.  Pulmonary/Chest: Accessory muscle usage present. Tachypnea noted. No respiratory distress. She has wheezes.  Initial evaluation, tachypneic, unable to complete sentences, wheezes throughout both lung fields  Abdominal: Soft. She exhibits no distension.  Musculoskeletal: Normal range of motion. She exhibits no edema.  Neurological: She is alert.  Skin: Skin is warm and dry.  Psychiatric: She has a normal mood and affect. Her behavior is normal.     UC Treatments / Results  Labs (all labs ordered are listed, but only abnormal results are displayed) Labs Reviewed - No data to display  EKG None  Radiology No results found.  Procedures Procedures  After the first nebulized treatment with albuterol/ipratropium the patient continued to have shortness of breath and considerable wheeze.  We waited 15 minutes and then tried a second treatment with albuterol.  This improved her respiratory status.  Lungs are largely clear with few inspiratory wheeze.  Medications Ordered in UC Medications  ipratropium-albuterol (DUONEB) 0.5-2.5 (3) MG/3ML nebulizer solution 3 mL (3 mLs Nebulization Given 09/25/18 0824)  methylPREDNISolone sodium succinate (SOLU-MEDROL) 125 mg/2 mL injection 125 mg (125 mg Intramuscular Given 09/25/18 0848)  albuterol (PROVENTIL) (2.5 MG/3ML) 0.083% nebulizer solution 2.5 mg (2.5 mg Nebulization Given 09/25/18 0906)    Initial Impression / Assessment and Plan / UC Course  I have reviewed the triage vital signs and the nursing notes.  Pertinent labs & imaging results that were available during my care of the patient were reviewed by me and considered in my medical decision making (see chart for details).     Asthma exacerbation.  Needs PCP Final Clinical Impressions(s) / UC Diagnoses    Final diagnoses:  Mild intermittent asthma with exacerbation  Acute respiratory distress   Discharge Instructions   None    ED Prescriptions    Medication Sig Dispense Auth. Provider   albuterol (PROVENTIL HFA;VENTOLIN HFA) 108 (90 Base) MCG/ACT inhaler Inhale 1-2 puffs into the lungs every 6 (six) hours as needed for wheezing or shortness of breath. 1 Inhaler Eustace MooreNelson, Soraya Paquette Sue, MD   albuterol (PROVENTIL) (2.5 MG/3ML) 0.083% nebulizer solution Take 3 mLs (2.5 mg total) by nebulization every 6 (six) hours as needed for wheezing or shortness of breath. 75 mL Eustace MooreNelson, Tyronn Golda Sue, MD  Controlled Substance Prescriptions [place] Controlled Substance Registry consulted? Not Applicable   Eustace Moore, MD 09/25/18 [phone]

## 2018-09-25 NOTE — Discharge Instructions (Signed)
Drink plenty of fluids Use albuterol as directed Take the prednisone 2 x a day for 5 days It is important that you find a PCP to see yearly and keep up with asthma refills

## 2018-09-25 NOTE — ED Triage Notes (Signed)
PT woke up this morning with asthma exacerbation. PT has an inhaler and nebulizer, but she has run out of both.

## 2018-11-22 ENCOUNTER — Ambulatory Visit: Payer: Medicaid Other | Admitting: Internal Medicine

## 2019-01-17 ENCOUNTER — Ambulatory Visit (HOSPITAL_COMMUNITY)
Admission: EM | Admit: 2019-01-17 | Discharge: 2019-01-17 | Disposition: A | Discharge status: Home / Self Care | Payer: Medicaid Other | Attending: Internal Medicine | Admitting: Internal Medicine

## 2019-01-17 ENCOUNTER — Encounter (HOSPITAL_COMMUNITY): Payer: Self-pay | Admitting: *Deleted

## 2019-01-17 DIAGNOSIS — J4541 Moderate persistent asthma with (acute) exacerbation: Secondary | ICD-10-CM | POA: Diagnosis not present

## 2019-01-17 MED ORDER — METHYLPREDNISOLONE SODIUM SUCC 125 MG IJ SOLR
60.0000 mg | Freq: Once | INTRAMUSCULAR | Status: AC
  Administered 2019-01-17: 19:00:00 via INTRAMUSCULAR

## 2019-01-17 MED ORDER — ALBUTEROL SULFATE HFA 108 (90 BASE) MCG/ACT IN AERS
INHALATION_SPRAY | RESPIRATORY_TRACT | Status: AC
  Filled 2019-01-17: qty 6.7

## 2019-01-17 MED ORDER — ALBUTEROL SULFATE HFA 108 (90 BASE) MCG/ACT IN AERS: 1.0000 | INHALATION_SPRAY | Freq: Four times a day (QID) | RESPIRATORY_TRACT | 1 refills | Status: DC | PRN

## 2019-01-17 MED ORDER — MONTELUKAST SODIUM 10 MG PO TABS: 10.0000 mg | ORAL_TABLET | Freq: Every day | ORAL | 1 refills | Status: DC

## 2019-01-17 MED ORDER — PREDNISONE 20 MG PO TABS: 20.0000 mg | ORAL_TABLET | Freq: Two times a day (BID) | ORAL | 0 refills | Status: AC

## 2019-01-17 MED ORDER — ALBUTEROL SULFATE (2.5 MG/3ML) 0.083% IN NEBU: 2.5000 mg | INHALATION_SOLUTION | Freq: Four times a day (QID) | RESPIRATORY_TRACT | 1 refills | Status: DC | PRN

## 2019-01-17 MED ORDER — ALBUTEROL SULFATE HFA 108 (90 BASE) MCG/ACT IN AERS
2.0000 | INHALATION_SPRAY | Freq: Once | RESPIRATORY_TRACT | Status: AC
  Administered 2019-01-17: 2 via RESPIRATORY_TRACT

## 2019-01-17 MED ORDER — AEROCHAMBER PLUS FLO-VU LARGE MISC
Status: AC
  Filled 2019-01-17: qty 1

## 2019-01-17 MED ORDER — METHYLPREDNISOLONE SODIUM SUCC 125 MG IJ SOLR
INTRAMUSCULAR | Status: AC
  Filled 2019-01-17: qty 2

## 2019-01-17 MED ORDER — ALBUTEROL SULFATE (2.5 MG/3ML) 0.083% IN NEBU: 2.5000 mg | INHALATION_SOLUTION | Freq: Once | RESPIRATORY_TRACT | Status: DC

## 2019-01-17 MED ORDER — AEROCHAMBER PLUS FLO-VU MISC
1.0000 | Freq: Once | Status: AC
  Administered 2019-01-17: 1

## 2019-01-17 NOTE — ED Triage Notes (Signed)
Patient reports cough x a couple days, states she has been staying with mom and she has cats which she is allergic too.   Patient states started feeling SOB last night, has hx of asthma. Patient is out of inhaler and albuterol for inhaler. Patient short of breath after walking from waiting room.

## 2019-01-17 NOTE — ED Provider Notes (Addendum)
[facility]    CSN: [id number] Arrival date & time: 01/17/19  1733     History   Chief Complaint Chief Complaint  Patient presents with  . Cough  . Asthma    HPI Jenna Combs is a 28 y.o. female with a history of environmental allergies, asthma managed with albuterol inhaler and nebulizer treatments comes to the urgent care department for a 2-day history of worsening shortness of breath, nonproductive cough, runny nose and wheezing.  Patient lives with her mother.  She is allergic to cats dogs and mold.  They have 15 cats in the house as well as mold.  She denies any fever or chills.  She is run out of her nebulizer treatment and albuterol inhaler.  Shortness of breath is aggravated by movement.  No known relieving factors.  Past Medical History:  Diagnosis Date  . Asthma   . Seizures [facility])     Patient Active Problem List   Diagnosis Date Noted  . Intermittent asthma without complication 09/25/2018    Past Surgical History:  Procedure Laterality Date  . BRAIN TUMOR EXCISION     As a child    OB History   No obstetric history on file.      Home Medications    Prior to Admission medications   Medication Sig Start Date End Date Taking? Authorizing Provider  acetaminophen (TYLENOL) 500 MG tablet Take 500-1,000 mg by mouth every 6 (six) hours as needed for mild pain or moderate pain.    [provider]  albuterol (PROVENTIL HFA;VENTOLIN HFA) 108 (90 Base) MCG/ACT inhaler Inhale 1-2 puffs into the lungs every 6 (six) hours as needed for wheezing or shortness of breath. 01/17/19   , Britta Mccreedy, MD  albuterol (PROVENTIL) (2.5 MG/3ML) 0.083% nebulizer solution Take 3 mLs (2.5 mg total) by nebulization every 6 (six) hours as needed for wheezing or shortness of breath. 01/17/19   , Britta Mccreedy, MD  montelukast (SINGULAIR) 10 MG tablet Take 1 tablet (10 mg total) by mouth at bedtime for 30 days. 01/17/19 02/16/19  Merrilee Jansky, MD  predniSONE  (DELTASONE) 20 MG tablet Take 1 tablet (20 mg total) by mouth 2 (two) times daily with a meal for 5 days. 01/17/19 01/22/19  Merrilee Jansky, MD    Family History Family History  Problem Relation Age of Onset  . Diabetes Mother   . Hypertension Father     Social History Social History   Tobacco Use  . Smoking status: Current Every Day Smoker    Packs/day: 0.25    Years: 0.00    Pack years: 0.00    Types: Cigarettes  . Smokeless tobacco: Never Used  Substance Use Topics  . Alcohol use: No  . Drug use: No     Allergies   Mold extract [trichophyton]   Review of Systems Review of Systems  Constitutional: Positive for activity change. Negative for appetite change, chills, fatigue and fever.  HENT: Positive for congestion, rhinorrhea and sneezing. Negative for ear pain, hearing loss, mouth sores, nosebleeds, postnasal drip, sinus pressure, sinus pain, sore throat, tinnitus and voice change.   Eyes: Positive for itching. Negative for pain and discharge.  Respiratory: Positive for cough, chest tightness, shortness of breath and wheezing. Negative for apnea, choking and stridor.   Cardiovascular: Negative for chest pain and palpitations.  Gastrointestinal: Negative for abdominal distention, abdominal pain, diarrhea, nausea and vomiting.  Endocrine: Negative for cold intolerance and heat intolerance.  Genitourinary: Negative for  dysuria, frequency and urgency.  Musculoskeletal: Negative for arthralgias, back pain, joint swelling and myalgias.  Allergic/Immunologic: Positive for environmental allergies.  Neurological: Negative for dizziness, light-headedness and headaches.  All other systems reviewed and are negative.    Physical Exam Triage Vital Signs ED Triage Vitals  Enc Vitals Group     BP 01/17/19 1817 (!) 131/96     Pulse Rate 01/17/19 1817 96     Resp 01/17/19 1817 (!) 26     Temp 01/17/19 1817 98.1 F (36.7 C)     Temp Source 01/17/19 1817 Oral     SpO2 01/17/19  1817 100 %     Weight --      Height --      Head Circumference --      Peak Flow --      Pain Score 01/17/19 1815 3     Pain Loc --      Pain Edu? --      Excl. in [place]? --    No data found.  Updated Vital Signs BP (!) 131/96 (BP Location: Right Arm)   Pulse 96   Temp 98.1 F (36.7 C) (Oral)   Resp (!) 26   LMP 01/01/2019   SpO2 100%   Visual Acuity Right Eye Distance:   Left Eye Distance:   Bilateral Distance:    Right Eye Near:   Left Eye Near:    Bilateral Near:     Physical Exam Constitutional:      General: She is in acute distress.  HENT:     Right Ear: Tympanic membrane normal.     Left Ear: Tympanic membrane normal.     Nose: Congestion and rhinorrhea present.     Mouth/Throat:     Mouth: Mucous membranes are moist.     Pharynx: No oropharyngeal exudate or posterior oropharyngeal erythema.  Eyes:     Conjunctiva/sclera: Conjunctivae normal.  Neck:     Musculoskeletal: Normal range of motion.  Cardiovascular:     Rate and Rhythm: Normal rate and regular rhythm.     Pulses: Normal pulses.     Heart sounds: No murmur. No gallop.   Pulmonary:     Effort: Respiratory distress present.     Breath sounds: No stridor. Wheezing and rales present. No rhonchi.  Chest:     Chest wall: No tenderness.  Abdominal:     General: Bowel sounds are normal. There is no distension.     Palpations: Abdomen is soft.     Tenderness: There is no abdominal tenderness.  Musculoskeletal: Normal range of motion.        General: No swelling or deformity.  Skin:    General: Skin is warm.     Capillary Refill: Capillary refill takes less than 2 seconds.     Coloration: Skin is not jaundiced.     Findings: No bruising.  Neurological:     General: No focal deficit present.     Mental Status: She is alert and oriented to person, place, and time.  Psychiatric:        Mood and Affect: Mood normal.      UC Treatments / Results  Labs (all labs ordered are listed, but only  abnormal results are displayed) Labs Reviewed - No data to display  EKG None  Radiology No results found.  Procedures Procedures (including critical care time)  Medications Ordered in UC Medications  methylPREDNISolone sodium succinate (SOLU-MEDROL) 125 mg/2 mL injection 60 mg (has no administration  in time range)  albuterol (PROVENTIL HFA;VENTOLIN HFA) 108 (90 Base) MCG/ACT inhaler 2 puff (has no administration in time range)  aerochamber plus with mask device 1 each (has no administration in time range)    Initial Impression / Assessment and Plan / UC Course  I have reviewed the triage vital signs and the nursing notes.  Pertinent labs & imaging results that were available during my care of the patient were reviewed by me and considered in my medical decision making (see chart for details).     1.  Moderate persistent asthma with acute exacerbation: Albuterol inhaler with spacer Solu-Medrol 60 mg IM x1 Discharge home with an albuterol nebulizer solutions 2.5 mg every 6 hours as needed Prednisone 40 mg orally daily x5 days Singulair 10 mg orally daily  Final Clinical Impressions(s) / UC Diagnoses   Final diagnoses:  Moderate persistent asthma with exacerbation   Discharge Instructions   None    ED Prescriptions    Medication Sig Dispense Auth. Provider   albuterol (PROVENTIL HFA;VENTOLIN HFA) 108 (90 Base) MCG/ACT inhaler Inhale 1-2 puffs into the lungs every 6 (six) hours as needed for wheezing or shortness of breath. 1 Inhaler , Britta Mccreedy, MD   albuterol (PROVENTIL) (2.5 MG/3ML) 0.083% nebulizer solution Take 3 mLs (2.5 mg total) by nebulization every 6 (six) hours as needed for wheezing or shortness of breath. 100 vial , Britta Mccreedy, MD   predniSONE (DELTASONE) 20 MG tablet Take 1 tablet (20 mg total) by mouth 2 (two) times daily with a meal for 5 days. 10 tablet , Britta Mccreedy, MD   montelukast (SINGULAIR) 10 MG tablet Take 1 tablet (10 mg total) by  mouth at bedtime for 30 days. 30 tablet , Britta Mccreedy, MD     Controlled Substance Prescriptions [place] Controlled Substance Registry consulted? No   Merrilee Jansky, MD 01/17/19 2841    Merrilee Jansky, MD 01/17/19 3244    Merrilee Jansky, MD 01/17/19 [phone]

## 2019-04-06 ENCOUNTER — Ambulatory Visit (HOSPITAL_COMMUNITY)
Admission: EM | Admit: 2019-04-06 | Discharge: 2019-04-06 | Disposition: A | Discharge status: Home / Self Care | Payer: Medicaid Other | Attending: Family Medicine | Admitting: Family Medicine

## 2019-04-06 ENCOUNTER — Encounter (HOSPITAL_COMMUNITY): Payer: Self-pay

## 2019-04-06 ENCOUNTER — Other Ambulatory Visit: Payer: Self-pay

## 2019-04-06 DIAGNOSIS — J4520 Mild intermittent asthma, uncomplicated: Secondary | ICD-10-CM | POA: Diagnosis not present

## 2019-04-06 DIAGNOSIS — Z760 Encounter for issue of repeat prescription: Secondary | ICD-10-CM | POA: Diagnosis not present

## 2019-04-06 MED ORDER — PREDNISONE 10 MG PO TABS: 20.0000 mg | ORAL_TABLET | Freq: Every day | ORAL | 0 refills | Status: DC

## 2019-04-06 MED ORDER — ALBUTEROL SULFATE (2.5 MG/3ML) 0.083% IN NEBU: 2.5000 mg | INHALATION_SOLUTION | Freq: Four times a day (QID) | RESPIRATORY_TRACT | 12 refills | Status: DC | PRN

## 2019-04-06 MED ORDER — ALBUTEROL SULFATE HFA 108 (90 BASE) MCG/ACT IN AERS: 2.0000 | INHALATION_SPRAY | RESPIRATORY_TRACT | 0 refills | Status: DC | PRN

## 2019-04-06 NOTE — ED Provider Notes (Addendum)
[facility]    CSN: [id number] Arrival date & time: 04/06/19  1349      History   Chief Complaint Chief Complaint  Patient presents with   Medication Refill    HPI Jenna Combs is a 28 y.o. female.   HPI Patient with a history of asthma and seizure presents today requesting a medication refill for her asthma inhaler. No PCP. Currently not prescribed LABA. Uses nebulizer machine typically once daily and albuterol inhaler while away from home on average three times per day. Endorses mostly shortness of breath with exertional activities and walking. Currently no chest tightness. Used nebulizer machine approximately 2 hours ago. Past Medical History:  Diagnosis Date   Asthma    Seizures [facility])     Patient Active Problem List   Diagnosis Date Noted   Intermittent asthma without complication 96/29/5284    Past Surgical History:  Procedure Laterality Date   BRAIN TUMOR EXCISION     As a child    OB History   No obstetric history on file.      Home Medications    Prior to Admission medications   Medication Sig Start Date End Date Taking? Authorizing Provider  acetaminophen (TYLENOL) 500 MG tablet Take 500-1,000 mg by mouth every 6 (six) hours as needed for mild pain or moderate pain.    [provider]  albuterol (PROVENTIL HFA;VENTOLIN HFA) 108 (90 Base) MCG/ACT inhaler Inhale 1-2 puffs into the lungs every 6 (six) hours as needed for wheezing or shortness of breath. 01/17/19   Lamptey, Myrene Galas, MD  albuterol (PROVENTIL) (2.5 MG/3ML) 0.083% nebulizer solution Take 3 mLs (2.5 mg total) by nebulization every 6 (six) hours as needed for wheezing or shortness of breath. 01/17/19   Lamptey, Myrene Galas, MD  montelukast (SINGULAIR) 10 MG tablet Take 1 tablet (10 mg total) by mouth at bedtime for 30 days. 01/17/19 02/16/19  LampteyMyrene Galas, MD    Family History Family History  Problem Relation Age of Onset   Diabetes Mother    Hypertension Father      Social History Social History   Tobacco Use   Smoking status: Current Every Day Smoker    Packs/day: 0.25    Years: 0.00    Pack years: 0.00    Types: Cigarettes   Smokeless tobacco: Never Used  Substance Use Topics   Alcohol use: No   Drug use: No     Allergies   Mold extract [trichophyton]   Review of Systems Review of Systems Pertinent negatives listed in HPI Physical Exam Triage Vital Signs ED Triage Vitals  Enc Vitals Group     BP 04/06/19 1408 129/71     Pulse Rate 04/06/19 1408 100     Resp 04/06/19 1408 18     Temp 04/06/19 1408 98.6 F (37 C)     Temp Source 04/06/19 1408 Oral     SpO2 04/06/19 1408 100 %     Weight 04/06/19 1405 260 lb (117.9 kg)     Height --      Head Circumference --      Peak Flow --      Pain Score 04/06/19 1405 1     Pain Loc --      Pain Edu? --      Excl. in [place]? --    No data found.  Updated Vital Signs BP 129/71 (BP Location: Right Arm)    Pulse 100    Temp 98.6 F (37  C) (Oral)    Resp 18    Wt 260 lb (117.9 kg)    LMP 04/03/2019    SpO2 100%    BMI 44.63 kg/m   Visual Acuity Right Eye Distance:   Left Eye Distance:   Bilateral Distance:    Right Eye Near:   Left Eye Near:    Bilateral Near:     Physical Exam General appearance: alert, well developed, well nourished, cooperative and in no distress Head: Normocephalic, without obvious abnormality, atraumatic Respiratory: Respirations even and unlabored, normal respiratory rate Heart: rate and rhythm normal. No gallop or murmurs noted on exam  Abdomen: BS +, no distention, no rebound tenderness, or no mass Extremities: No gross deformities Skin: Skin color, texture, turgor normal. No rashes seen  Psych: Appropriate mood and affect. Neurologic: Mental status: Alert, oriented to person, place, and time, thought content appropriate.  UC Treatments / Results  Labs (all labs ordered are listed, but only abnormal results are displayed) Labs Reviewed - No  data to display  EKG None  Radiology No results found.  Procedures Procedures (including critical care time)  Medications Ordered in UC Medications - No data to display  Initial Impression / Assessment and Plan / UC Course  I have reviewed the triage vital signs and the nursing notes.  Pertinent labs & imaging results that were available during my care of the patient were reviewed by me and considered in my medical decision making (see chart for details).   Patient with mild to moderate asthma without current exacerbation presents today for medication refill.  Patient highly encouraged to establish with a PCP for appropriate management and monitoring of asthma.  Refilled medications as requested.  Added a short course of prednisone as patient has had daily episodes of shortness of breath and mild wheezing.  Patient verbalized agreement and understanding of plan.   Final Clinical Impressions(s) / UC Diagnoses   Final diagnoses:  Medication refill  Mild intermittent asthma, unspecified whether complicated   Discharge Instructions   None    ED Prescriptions    Medication Sig Dispense Auth. Provider   albuterol (VENTOLIN HFA) 108 (90 Base) MCG/ACT inhaler Inhale 2 puffs into the lungs every 4 (four) hours as needed for wheezing or shortness of breath (cough, shortness of breath or wheezing.). 8 g Bing NeighborsHarris, Jaydee Conran S, FNP   albuterol (PROVENTIL) (2.5 MG/3ML) 0.083% nebulizer solution Take 3 mLs (2.5 mg total) by nebulization every 6 (six) hours as needed for wheezing or shortness of breath. 75 mL Bing NeighborsHarris, Serine Kea S, FNP   predniSONE (DELTASONE) 10 MG tablet Take 2 tablets (20 mg total) by mouth daily. 10 tablet Bing NeighborsHarris, Keimora Swartout S, FNP     Controlled Substance Prescriptions [place] Controlled Substance Registry consulted? Not Applicable   Bing NeighborsHarris, Lydiann Bonifas S, FNP 04/06/19 1507    Bing NeighborsHarris, Indy Kuck S, FNP 04/11/19 [phone]

## 2019-04-06 NOTE — ED Triage Notes (Signed)
Pt states she needs a med refill on her inhaler.

## 2019-04-17 ENCOUNTER — Other Ambulatory Visit: Payer: Self-pay

## 2019-04-17 ENCOUNTER — Encounter (HOSPITAL_COMMUNITY): Payer: Self-pay | Admitting: Emergency Medicine

## 2019-04-17 ENCOUNTER — Ambulatory Visit (HOSPITAL_COMMUNITY)
Admission: EM | Admit: 2019-04-17 | Discharge: 2019-04-17 | Disposition: A | Discharge status: Home / Self Care | Payer: Medicaid Other | Attending: Internal Medicine | Admitting: Internal Medicine

## 2019-04-17 DIAGNOSIS — J4520 Mild intermittent asthma, uncomplicated: Secondary | ICD-10-CM

## 2019-04-17 NOTE — ED Triage Notes (Signed)
PT has asthma, but her nebulizer machine broke. PT is over-using her rescue inhaler and needs a new neb machine.

## 2019-04-17 NOTE — ED Provider Notes (Signed)
[facility]    CSN: [id number] Arrival date & time: 04/17/19  1332     History   Chief Complaint Chief Complaint  Patient presents with  . Asthma    HPI Jenna Combs is a 28 y.o. female with a history of asthma comes to urgent care requesting a new nebulizer machine since her nebulizer machine broke down.  Patient uses a nebulizer machine twice a day but has been using hand-held inhaler more frequently after the machine broke.  She denies any shortness of breath, cough or sputum production at this time.  No nausea vomiting.  No fever or chills.Marland Kitchen.   HPI  Past Medical History:  Diagnosis Date  . Asthma   . Seizures [facility](HCC)     Patient Active Problem List   Diagnosis Date Noted  . Intermittent asthma without complication 09/25/2018    Past Surgical History:  Procedure Laterality Date  . BRAIN TUMOR EXCISION     As a child    OB History   No obstetric history on file.      Home Medications    Prior to Admission medications   Medication Sig Start Date End Date Taking? Authorizing Provider  albuterol (PROVENTIL HFA;VENTOLIN HFA) 108 (90 Base) MCG/ACT inhaler Inhale 1-2 puffs into the lungs every 6 (six) hours as needed for wheezing or shortness of breath. 01/17/19  Yes Stevey Stapleton, Britta MccreedyPhilip O, MD  albuterol (PROVENTIL) (2.5 MG/3ML) 0.083% nebulizer solution Take 3 mLs (2.5 mg total) by nebulization every 6 (six) hours as needed for wheezing or shortness of breath. 01/17/19  Yes Uthman Mroczkowski, Britta MccreedyPhilip O, MD  predniSONE (DELTASONE) 10 MG tablet Take 2 tablets (20 mg total) by mouth daily. 04/06/19  Yes Bing NeighborsHarris, Kimberly S, FNP  acetaminophen (TYLENOL) 500 MG tablet Take 500-1,000 mg by mouth every 6 (six) hours as needed for mild pain or moderate pain.    [provider]  albuterol (PROVENTIL) (2.5 MG/3ML) 0.083% nebulizer solution Take 3 mLs (2.5 mg total) by nebulization every 6 (six) hours as needed for wheezing or shortness of breath. 04/06/19   Bing NeighborsHarris, Kimberly S,  FNP  albuterol (VENTOLIN HFA) 108 (90 Base) MCG/ACT inhaler Inhale 2 puffs into the lungs every 4 (four) hours as needed for wheezing or shortness of breath (cough, shortness of breath or wheezing.). 04/06/19   Bing NeighborsHarris, Kimberly S, FNP  montelukast (SINGULAIR) 10 MG tablet Take 1 tablet (10 mg total) by mouth at bedtime for 30 days. 01/17/19 02/16/19  Merrilee JanskyLamptey, Enez Monahan O, MD    Family History Family History  Problem Relation Age of Onset  . Diabetes Mother   . Hypertension Father     Social History Social History   Tobacco Use  . Smoking status: Current Every Day Smoker    Packs/day: 0.25    Years: 0.00    Pack years: 0.00    Types: Cigarettes  . Smokeless tobacco: Never Used  Substance Use Topics  . Alcohol use: No  . Drug use: No     Allergies   Mold extract [trichophyton]   Review of Systems Review of Systems  Constitutional: Negative.   Respiratory: Negative.   Gastrointestinal: Negative.   Genitourinary: Negative.   Psychiatric/Behavioral: Negative.      Physical Exam Triage Vital Signs ED Triage Vitals  Enc Vitals Group     BP 04/17/19 1407 117/83     Pulse Rate 04/17/19 1405 91     Resp 04/17/19 1405 16     Temp 04/17/19 1407 99.1 F (  37.3 C)     Temp Source 04/17/19 1407 Oral     SpO2 04/17/19 1405 98 %     Weight --      Height --      Head Circumference --      Peak Flow --      Pain Score 04/17/19 1403 0     Pain Loc --      Pain Edu? --      Excl. in [place]? --    No data found.  Updated Vital Signs BP 117/83   Pulse 91   Temp 99.1 F (37.3 C) (Oral)   Resp 16   LMP 04/03/2019   SpO2 98%   Visual Acuity Right Eye Distance:   Left Eye Distance:   Bilateral Distance:    Right Eye Near:   Left Eye Near:    Bilateral Near:     Physical Exam Constitutional:      General: She is not in acute distress.    Appearance: Normal appearance. She is not ill-appearing or toxic-appearing.  Cardiovascular:     Rate and Rhythm: Normal rate and  regular rhythm.     Pulses: Normal pulses.     Heart sounds: Normal heart sounds.  Pulmonary:     Effort: Pulmonary effort is normal. No respiratory distress.     Breath sounds: Normal breath sounds. No wheezing or rales.  Abdominal:     General: Bowel sounds are normal.     Palpations: Abdomen is soft.  Musculoskeletal: Normal range of motion.  Skin:    Capillary Refill: Capillary refill takes less than 2 seconds.  Neurological:     Mental Status: She is alert.      UC Treatments / Results  Labs (all labs ordered are listed, but only abnormal results are displayed) Labs Reviewed - No data to display  EKG   Radiology No results found.  Procedures Procedures (including critical care time)  Medications Ordered in UC Medications - No data to display  Initial Impression / Assessment and Plan / UC Course  I have reviewed the triage vital signs and the nursing notes.  Pertinent labs & imaging results that were available during my care of the patient were reviewed by me and considered in my medical decision making (see chart for details).     1.  Mild intermittent asthma: Continue albuterol inhaler use Nebulizer machine will be prescribed from here Patient has a course of steroids which she can use if she gets any exacerbation. Final Clinical Impressions(s) / UC Diagnoses   Final diagnoses:  Mild persistent asthma with acute exacerbation   Discharge Instructions   None    ED Prescriptions    None     Controlled Substance Prescriptions [place] Controlled Substance Registry consulted? No   Chase Picket, MD 04/18/19 [phone]

## 2019-05-25 ENCOUNTER — Other Ambulatory Visit: Payer: Self-pay

## 2019-05-25 ENCOUNTER — Ambulatory Visit (HOSPITAL_COMMUNITY)
Admission: EM | Admit: 2019-05-25 | Discharge: 2019-05-25 | Disposition: A | Discharge status: Home / Self Care | Payer: Medicaid Other | Attending: Family Medicine | Admitting: Family Medicine

## 2019-05-25 ENCOUNTER — Encounter (HOSPITAL_COMMUNITY): Payer: Self-pay

## 2019-05-25 DIAGNOSIS — Z760 Encounter for issue of repeat prescription: Secondary | ICD-10-CM

## 2019-05-25 DIAGNOSIS — F17210 Nicotine dependence, cigarettes, uncomplicated: Secondary | ICD-10-CM | POA: Diagnosis not present

## 2019-05-25 DIAGNOSIS — J4520 Mild intermittent asthma, uncomplicated: Secondary | ICD-10-CM

## 2019-05-25 MED ORDER — ALBUTEROL SULFATE HFA 108 (90 BASE) MCG/ACT IN AERS: 1.0000 | INHALATION_SPRAY | Freq: Four times a day (QID) | RESPIRATORY_TRACT | 0 refills | Status: DC | PRN

## 2019-05-25 MED ORDER — ALBUTEROL SULFATE (2.5 MG/3ML) 0.083% IN NEBU: 2.5000 mg | INHALATION_SOLUTION | Freq: Four times a day (QID) | RESPIRATORY_TRACT | 0 refills | Status: DC | PRN

## 2019-05-25 NOTE — Discharge Instructions (Signed)
Albuterol inhaler and nebulizer refilled Follow up with primary care as you likely need a daily inhaler to better control your asthma

## 2019-05-25 NOTE — ED Triage Notes (Signed)
Patient presents to Urgent Care with complaints of needing an inhaler refill since she ran out and does not have a PCP appt until next month.

## 2019-05-26 NOTE — ED Provider Notes (Signed)
[facility]    CSN: [id number] Arrival date & time: 05/25/19  1109      History   Chief Complaint Chief Complaint  Patient presents with  . Medication Refill    HPI Jenna Combs is a 28 y.o. female history of asthma, seizures presenting today for medication refill of albuterol inhaler.  Patient states that she has ran out of her inhaler as well as nebulizer medicines.  She states that she has establish a PCP appointment for September 9.  She notes that she frequently has to use her albuterol inhaler or nebulizers.  States that she needs these usually twice daily.  She denies being on a daily maintenance inhaler.  States that she has moved home to live with her mom who has cats and believes this is the trigger for her worsening asthma and allergies.  She denies significant wheezing or shortness of breath at this time.  States that she has prednisone at home that she uses as needed, but has not initiated treatment with this yet as she feels her symptoms can be controlled with albuterol alone.  HPI  Past Medical History:  Diagnosis Date  . Asthma   . Seizures [facility])     Patient Active Problem List   Diagnosis Date Noted  . Intermittent asthma without complication 25/85/2778    Past Surgical History:  Procedure Laterality Date  . BRAIN TUMOR EXCISION     As a child    OB History   No obstetric history on file.      Home Medications    Prior to Admission medications   Medication Sig Start Date End Date Taking? Authorizing Provider  acetaminophen (TYLENOL) 500 MG tablet Take 500-1,000 mg by mouth every 6 (six) hours as needed for mild pain or moderate pain.    [provider]  albuterol (PROVENTIL) (2.5 MG/3ML) 0.083% nebulizer solution Take 3 mLs (2.5 mg total) by nebulization every 6 (six) hours as needed for wheezing or shortness of breath. 05/25/19   Severiano Utsey C, PA-C  albuterol (VENTOLIN HFA) 108 (90 Base) MCG/ACT inhaler Inhale 1-2 puffs  into the lungs every 6 (six) hours as needed for wheezing or shortness of breath. 05/25/19   Alven Alverio C, PA-C  montelukast (SINGULAIR) 10 MG tablet Take 1 tablet (10 mg total) by mouth at bedtime for 30 days. 01/17/19 02/16/19  Chase Picket, MD  predniSONE (DELTASONE) 10 MG tablet Take 2 tablets (20 mg total) by mouth daily. 04/06/19   Scot Jun, FNP    Family History Family History  Problem Relation Age of Onset  . Diabetes Mother   . Hypertension Father     Social History Social History   Tobacco Use  . Smoking status: Current Every Day Smoker    Packs/day: 0.25    Years: 0.00    Pack years: 0.00    Types: Cigarettes  . Smokeless tobacco: Never Used  Substance Use Topics  . Alcohol use: No  . Drug use: No     Allergies   Mold extract [trichophyton]   Review of Systems Review of Systems  Constitutional: Negative for activity change, appetite change, chills, fatigue and fever.  HENT: Negative for congestion, ear pain, rhinorrhea, sinus pressure, sore throat and trouble swallowing.   Eyes: Negative for discharge and redness.  Respiratory: Positive for chest tightness and shortness of breath. Negative for cough.   Cardiovascular: Negative for chest pain.  Gastrointestinal: Negative for abdominal pain, diarrhea, nausea and vomiting.  Musculoskeletal: Negative for myalgias.  Skin: Negative for rash.  Neurological: Negative for dizziness, light-headedness and headaches.     Physical Exam Triage Vital Signs ED Triage Vitals  Enc Vitals Group     BP 05/25/19 1206 125/84     Pulse Rate 05/25/19 1206 70     Resp 05/25/19 1206 16     Temp 05/25/19 1206 98 F (36.7 C)     Temp Source 05/25/19 1206 Oral     SpO2 05/25/19 1206 97 %     Weight --      Height --      Head Circumference --      Peak Flow --      Pain Score 05/25/19 1205 0     Pain Loc --      Pain Edu? --      Excl. in GC? --    No data found.  Updated Vital Signs BP 125/84 (BP  Location: Left Arm)   Pulse 70   Temp 98 F (36.7 C) (Oral)   Resp 16   SpO2 97%   Visual Acuity Right Eye Distance:   Left Eye Distance:   Bilateral Distance:    Right Eye Near:   Left Eye Near:    Bilateral Near:     Physical Exam Vitals signs and nursing note reviewed.  Constitutional:      General: She is not in acute distress.    Appearance: She is well-developed.  HENT:     Head: Normocephalic and atraumatic.     Mouth/Throat:     Comments: Oral mucosa pink and moist, no tonsillar enlargement or exudate. Posterior pharynx patent and nonerythematous, no uvula deviation or swelling. Normal phonation. Eyes:     Conjunctiva/sclera: Conjunctivae normal.  Neck:     Musculoskeletal: Neck supple.  Cardiovascular:     Rate and Rhythm: Normal rate and regular rhythm.     Heart sounds: No murmur.  Pulmonary:     Effort: Pulmonary effort is normal. No respiratory distress.     Breath sounds: Normal breath sounds.     Comments: Breathing comfortably at rest, CTABL, no wheezing, rales or other adventitious sounds auscultated  Abdominal:     Palpations: Abdomen is soft.     Tenderness: There is no abdominal tenderness.  Skin:    General: Skin is warm and dry.  Neurological:     Mental Status: She is alert.      UC Treatments / Results  Labs (all labs ordered are listed, but only abnormal results are displayed) Labs Reviewed - No data to display  EKG   Radiology No results found.  Procedures Procedures (including critical care time)  Medications Ordered in UC Medications - No data to display  Initial Impression / Assessment and Plan / UC Course  I have reviewed the triage vital signs and the nursing notes.  Pertinent labs & imaging results that were available during my care of the patient were reviewed by me and considered in my medical decision making (see chart for details).     Refilled patient's albuterol and nebulizer medicines.  Discussed with  patient that given she is needing these rescue medicines daily she likely needs to be on a daily maintenance inhaler to better control her asthma.  Advised to discuss this with PCP when she establishes care on the ninth.  Lungs clear and vital signs stable today.  Will hold off on any steroid therapy given patient has prednisone at home and would  like to continue to be mainly use albuterol.Discussed strict return precautions. Patient verbalized understanding and is agreeable with plan.  Final Clinical Impressions(s) / UC Diagnoses   Final diagnoses:  Mild intermittent asthma without complication  Medication refill     Discharge Instructions     Albuterol inhaler and nebulizer refilled Follow up with primary care as you likely need a daily inhaler to better control your asthma   ED Prescriptions    Medication Sig Dispense Auth. Provider   albuterol (VENTOLIN HFA) 108 (90 Base) MCG/ACT inhaler Inhale 1-2 puffs into the lungs every 6 (six) hours as needed for wheezing or shortness of breath. 18 g Jalesha Plotz C, PA-C   albuterol (PROVENTIL) (2.5 MG/3ML) 0.083% nebulizer solution Take 3 mLs (2.5 mg total) by nebulization every 6 (six) hours as needed for wheezing or shortness of breath. 200 mL Cully Luckow C, PA-C     Controlled Substance Prescriptions [place] Controlled Substance Registry consulted? Not Applicable   Lew DawesWieters, Maribel Hadley C, [place] JerseyPA-C 05/26/19 [id number]

## 2019-06-12 ENCOUNTER — Other Ambulatory Visit: Payer: Self-pay

## 2019-06-12 ENCOUNTER — Encounter (HOSPITAL_COMMUNITY): Payer: Self-pay

## 2019-06-12 ENCOUNTER — Ambulatory Visit (HOSPITAL_COMMUNITY)
Admission: EM | Admit: 2019-06-12 | Discharge: 2019-06-12 | Disposition: A | Discharge status: Home / Self Care | Payer: Medicaid Other | Attending: Urgent Care | Admitting: Urgent Care

## 2019-06-12 DIAGNOSIS — J4530 Mild persistent asthma, uncomplicated: Secondary | ICD-10-CM

## 2019-06-12 DIAGNOSIS — Z760 Encounter for issue of repeat prescription: Secondary | ICD-10-CM

## 2019-06-12 MED ORDER — ALBUTEROL SULFATE HFA 108 (90 BASE) MCG/ACT IN AERS: 1.0000 | INHALATION_SPRAY | Freq: Four times a day (QID) | RESPIRATORY_TRACT | 0 refills | Status: DC | PRN

## 2019-06-12 NOTE — ED Triage Notes (Signed)
Pt states she needs inhaler ( albuterol ). Pt states her asthma is flaring up.

## 2019-06-12 NOTE — ED Provider Notes (Signed)
MRN: [id number] DOB: 09-02-1991  Subjective:   Jenna Combs is a 28 y.o. female presenting for medication refill of her albuterol inhaler.  Patient is currently in the process of establishing care with a PCP through [facility] internal medicine.  Today, she reports her typical wheezing and shortness of breath very closely related to the nature of her work which is requiring her to do a lot of physical movement, walking.  She denies fevers, sinus pain, throat pain, cough, chest pain, nausea, vomiting, belly pain.  She does have a history of allergies as well and is using her allergy medications for this too.  She is requesting a steroid course today.  No current facility-administered medications for this encounter.   Current Outpatient Medications:  .  acetaminophen (TYLENOL) 500 MG tablet, Take 500-1,000 mg by mouth every 6 (six) hours as needed for mild pain or moderate pain., Disp: , Rfl:  .  albuterol (PROVENTIL) (2.5 MG/3ML) 0.083% nebulizer solution, Take 3 mLs (2.5 mg total) by nebulization every 6 (six) hours as needed for wheezing or shortness of breath., Disp: 200 mL, Rfl: 0 .  albuterol (VENTOLIN HFA) 108 (90 Base) MCG/ACT inhaler, Inhale 1-2 puffs into the lungs every 6 (six) hours as needed for wheezing or shortness of breath., Disp: 18 g, Rfl: 0 .  montelukast (SINGULAIR) 10 MG tablet, Take 1 tablet (10 mg total) by mouth at bedtime for 30 days., Disp: 30 tablet, Rfl: 1 .  predniSONE (DELTASONE) 10 MG tablet, Take 2 tablets (20 mg total) by mouth daily., Disp: 10 tablet, Rfl: 0    Allergies  Allergen Reactions  . Mold Extract [Trichophyton] Other (See Comments)    Sneezing, runny nose    Past Medical History:  Diagnosis Date  . Asthma   . Seizures ([place])      Past Surgical History:  Procedure Laterality Date  . BRAIN TUMOR EXCISION     As a child    ROS  Objective:   Vitals: BP 116/83 (BP Location: Left Arm)   Pulse 80   Temp 98.3 F (36.8 C) (Oral)   Resp 16    Wt 265 lb (120.2 kg)   LMP 05/29/2019   SpO2 100%   BMI 45.49 kg/m   Physical Exam Constitutional:      General: She is not in acute distress.    Appearance: Normal appearance. She is well-developed. She is obese. She is not ill-appearing, toxic-appearing or diaphoretic.  HENT:     Head: Normocephalic and atraumatic.     Nose: Nose normal.     Mouth/Throat:     Mouth: Mucous membranes are moist.  Eyes:     Extraocular Movements: Extraocular movements intact.     Pupils: Pupils are equal, round, and reactive to light.  Cardiovascular:     Rate and Rhythm: Normal rate and regular rhythm.     Pulses: Normal pulses.     Heart sounds: Normal heart sounds. No murmur. No friction rub. No gallop.   Pulmonary:     Effort: Pulmonary effort is normal. No respiratory distress.     Breath sounds: Normal breath sounds. No stridor. No wheezing, rhonchi or rales.  Skin:    General: Skin is warm and dry.     Findings: No rash.  Neurological:     Mental Status: She is alert and oriented to person, place, and time.  Psychiatric:        Mood and Affect: Mood normal.  Behavior: Behavior normal.        Thought Content: Thought content normal.        Judgment: Judgment normal.      Assessment and Plan :   1. Mild persistent asthma without complication   2. Medication refill     Refilled her albuterol inhaler and counseled on appropriate use of this medication.  I counseled against using prednisone, patient was agreeable.  She has very reassuring vital signs, physical exam findings and recommended she seek a consult with a pulmonologist through her new PCP to consider whether or not an inhaled steroid would be a better course of action.  Patient was very understanding.  Taking medications for allergic rhinitis. Counseled patient on potential for adverse effects with medications prescribed/recommended today, ER and return-to-clinic precautions discussed, patient verbalized  understanding.    Wallis BambergMani, Veronique Warga, PA-C 06/12/19 1025

## 2019-06-26 ENCOUNTER — Ambulatory Visit: Payer: Medicaid Other | Attending: Family Medicine | Admitting: Family Medicine

## 2019-06-26 ENCOUNTER — Other Ambulatory Visit: Payer: Self-pay

## 2019-06-26 ENCOUNTER — Encounter: Payer: Self-pay | Admitting: Family Medicine

## 2019-06-26 DIAGNOSIS — Z720 Tobacco use: Secondary | ICD-10-CM

## 2019-06-26 DIAGNOSIS — M545 Low back pain, unspecified: Secondary | ICD-10-CM

## 2019-06-26 DIAGNOSIS — J4540 Moderate persistent asthma, uncomplicated: Secondary | ICD-10-CM | POA: Diagnosis not present

## 2019-06-26 DIAGNOSIS — L0231 Cutaneous abscess of buttock: Secondary | ICD-10-CM | POA: Diagnosis not present

## 2019-06-26 DIAGNOSIS — G8929 Other chronic pain: Secondary | ICD-10-CM

## 2019-06-26 MED ORDER — ALBUTEROL SULFATE (2.5 MG/3ML) 0.083% IN NEBU: 2.5000 mg | INHALATION_SOLUTION | Freq: Four times a day (QID) | RESPIRATORY_TRACT | 5 refills | Status: AC | PRN

## 2019-06-26 MED ORDER — FLOVENT HFA 110 MCG/ACT IN AERO: 2.0000 | INHALATION_SPRAY | Freq: Two times a day (BID) | RESPIRATORY_TRACT | 5 refills | Status: DC

## 2019-06-26 MED ORDER — ALBUTEROL SULFATE HFA 108 (90 BASE) MCG/ACT IN AERS: 1.0000 | INHALATION_SPRAY | Freq: Four times a day (QID) | RESPIRATORY_TRACT | 5 refills | Status: AC | PRN

## 2019-06-26 NOTE — Patient Instructions (Signed)
Asthma Attack Prevention, Adult °Although you may not be able to control the fact that you have asthma, you can take actions to prevent episodes of asthma (asthma attacks). These actions include: °· Creating a written plan for managing and treating your asthma attacks (asthma action plan). °· Monitoring your asthma. °· Avoiding things that can irritate your airways or make your asthma symptoms worse (asthma triggers). °· Taking your medicines as directed. °· Acting quickly if you have signs or symptoms of an asthma attack. °What are some ways to prevent an asthma attack? °Create a plan °Work with your health care provider to create an asthma action plan. This plan should include: °· A list of your asthma triggers and how to avoid them. °· A list of symptoms that you experience during an asthma attack. °· Information about when to take medicine and how much medicine to take. °· Information to help you understand your peak flow measurements. °· Contact information for your health care providers. °· Daily actions that you can take to control asthma. °Monitor your asthma °To monitor your asthma: °· Use your peak flow meter every morning and every evening for 2-3 weeks. Record the results in a journal. A drop in your peak flow numbers on one or more days may mean that you are starting to have an asthma attack, even if you are not having symptoms. °· When you have asthma symptoms, write them down in a journal. ° °Avoid asthma triggers °Work with your health care provider to find out what your asthma triggers are. This can be done by: °· Being tested for allergies. °· Keeping a journal that notes when asthma attacks occur and what may have contributed to them. °· Asking your health care provider whether other medical conditions make your asthma worse. °Common asthma triggers include: °· Dust. °· Smoke. This includes campfire smoke and secondhand smoke from tobacco products. °· Pet dander. °· Trees, grasses or  pollens. °· Very cold, dry, or humid air. °· Mold. °· Foods that contain high amounts of sulfites. °· Strong smells. °· Engine exhaust and air pollution. °· Aerosol sprays and fumes from household cleaners. °· Household pests and their droppings, including dust mites and cockroaches. °· Certain medicines, including NSAIDs. °Once you have determined your asthma triggers, take steps to avoid them. Depending on your triggers, you may be able to reduce the chance of an asthma attack by: °· Keeping your home clean. Have someone dust and vacuum your home for you 1 or 2 times a week. If possible, have them use a high-efficiency particulate arrestance (HEPA) vacuum. °· Washing your sheets weekly in hot water. °· Using allergy-proof mattress covers and casings on your bed. °· Keeping pets out of your home. °· Taking care of mold and water problems in your home. °· Avoiding areas where people smoke. °· Avoiding using strong perfumes or odor sprays. °· Avoid spending a lot of time outdoors when pollen counts are high and on very windy days. °· Talking with your health care provider before stopping or starting any new medicines. °Medicines °Take over-the-counter and prescription medicines only as told by your health care provider. Many asthma attacks can be prevented by carefully following your medicine schedule. Taking your medicines correctly is especially important when you cannot avoid certain asthma triggers. Even if you are doing well, do not stop taking your medicine and do not take less medicine. °Act quickly °If an asthma attack happens, acting quickly can decrease how severe it is and   how long it lasts. Take these actions:  Pay attention to your symptoms. If you are coughing, wheezing, or having difficulty breathing, do not wait to see if your symptoms go away on their own. Follow your asthma action plan.  If you have followed your asthma action plan and your symptoms are not improving, call your health care  provider or seek immediate medical care at the nearest hospital. It is important to write down how often you need to use your fast-acting rescue inhaler. You can track how often you use an inhaler in your journal. If you are using your rescue inhaler more often, it may mean that your asthma is not under control. Adjusting your asthma treatment plan may help you to prevent future asthma attacks and help you to gain better control of your condition. How can I prevent an asthma attack when I exercise? Exercise is a common asthma trigger. To prevent asthma attacks during exercise:  Follow advice from your health care provider about whether you should use your fast-acting inhaler before exercising. Many people with asthma experience exercise-induced bronchoconstriction (EIB). This condition often worsens during vigorous exercise in cold, humid, or dry environments. Usually, people with EIB can stay very active by using a fast-acting inhaler before exercising.  Avoid exercising outdoors in very cold or humid weather.  Avoid exercising outdoors when pollen counts are high.  Warm up and cool down when exercising.  Stop exercising right away if asthma symptoms start. Consider taking part in exercises that are less likely to cause asthma symptoms such as:  Indoor swimming.  Biking.  Walking.  Hiking.  Playing football. This information is not intended to replace advice given to you by your health care provider. Make sure you discuss any questions you have with your health care provider. Document Released: 09/21/2009 Document Revised: 09/15/2017 Document Reviewed: 03/19/2016 Elsevier Patient Education  2020 [employer]  Asthma Attack Prevention, Adult Although you may not be able to control the fact that you have asthma, you can take actions to prevent episodes of asthma (asthma attacks). These actions include:  Creating a written plan for managing and treating your asthma attacks (asthma  action plan).  Monitoring your asthma.  Avoiding things that can irritate your airways or make your asthma symptoms worse (asthma triggers).  Taking your medicines as directed.  Acting quickly if you have signs or symptoms of an asthma attack. What are some ways to prevent an asthma attack? Create a plan Work with your health care provider to create an asthma action plan. This plan should include:  A list of your asthma triggers and how to avoid them.  A list of symptoms that you experience during an asthma attack.  Information about when to take medicine and how much medicine to take.  Information to help you understand your peak flow measurements.  Contact information for your health care providers.  Daily actions that you can take to control asthma. Monitor your asthma To monitor your asthma:  Use your peak flow meter every morning and every evening for 2-3 weeks. Record the results in a journal. A drop in your peak flow numbers on one or more days may mean that you are starting to have an asthma attack, even if you are not having symptoms.  When you have asthma symptoms, write them down in a journal.  Avoid asthma triggers Work with your health care provider to find out what your asthma triggers are. This can be done by:  Being tested  for allergies.  Keeping a journal that notes when asthma attacks occur and what may have contributed to them.  Asking your health care provider whether other medical conditions make your asthma worse. Common asthma triggers include:  Dust.  Smoke. This includes campfire smoke and secondhand smoke from tobacco products.  Pet dander.  Trees, grasses or pollens.  Very cold, dry, or humid air.  Mold.  Foods that contain high amounts of sulfites.  Strong smells.  Engine exhaust and air pollution.  Aerosol sprays and fumes from household cleaners.  Household pests and their droppings, including dust mites and  cockroaches.  Certain medicines, including NSAIDs. Once you have determined your asthma triggers, take steps to avoid them. Depending on your triggers, you may be able to reduce the chance of an asthma attack by:  Keeping your home clean. Have someone dust and vacuum your home for you 1 or 2 times a week. If possible, have them use a high-efficiency particulate arrestance (HEPA) vacuum.  Washing your sheets weekly in hot water.  Using allergy-proof mattress covers and casings on your bed.  Keeping pets out of your home.  Taking care of mold and water problems in your home.  Avoiding areas where people smoke.  Avoiding using strong perfumes or odor sprays.  Avoid spending a lot of time outdoors when pollen counts are high and on very windy days.  Talking with your health care provider before stopping or starting any new medicines. Medicines Take over-the-counter and prescription medicines only as told by your health care provider. Many asthma attacks can be prevented by carefully following your medicine schedule. Taking your medicines correctly is especially important when you cannot avoid certain asthma triggers. Even if you are doing well, do not stop taking your medicine and do not take less medicine. Act quickly If an asthma attack happens, acting quickly can decrease how severe it is and how long it lasts. Take these actions:  Pay attention to your symptoms. If you are coughing, wheezing, or having difficulty breathing, do not wait to see if your symptoms go away on their own. Follow your asthma action plan.  If you have followed your asthma action plan and your symptoms are not improving, call your health care provider or seek immediate medical care at the nearest hospital. It is important to write down how often you need to use your fast-acting rescue inhaler. You can track how often you use an inhaler in your journal. If you are using your rescue inhaler more often, it may mean  that your asthma is not under control. Adjusting your asthma treatment plan may help you to prevent future asthma attacks and help you to gain better control of your condition. How can I prevent an asthma attack when I exercise? Exercise is a common asthma trigger. To prevent asthma attacks during exercise:  Follow advice from your health care provider about whether you should use your fast-acting inhaler before exercising. Many people with asthma experience exercise-induced bronchoconstriction (EIB). This condition often worsens during vigorous exercise in cold, humid, or dry environments. Usually, people with EIB can stay very active by using a fast-acting inhaler before exercising.  Avoid exercising outdoors in very cold or humid weather.  Avoid exercising outdoors when pollen counts are high.  Warm up and cool down when exercising.  Stop exercising right away if asthma symptoms start. Consider taking part in exercises that are less likely to cause asthma symptoms such as:  Indoor swimming.  Biking.  Walking.  Hiking.  Playing football. This information is not intended to replace advice given to you by your health care provider. Make sure you discuss any questions you have with your health care provider. Document Released: 09/21/2009 Document Revised: 09/15/2017 Document Reviewed: 03/19/2016 Elsevier Patient Education  2020 [employer].  Asthma, Adult  Asthma is a long-term (chronic) condition in which the airways get tight and narrow. The airways are the breathing passages that lead from the nose and mouth down into the lungs. A person with asthma will have times when symptoms get worse. These are called asthma attacks. They can cause coughing, whistling sounds when you breathe (wheezing), shortness of breath, and chest pain. They can make it hard to breathe. There is no cure for asthma, but medicines and lifestyle changes can help control it. There are many things that can bring  on an asthma attack or make asthma symptoms worse (triggers). Common triggers include:  Mold.  Dust.  Cigarette smoke.  Cockroaches.  Things that can cause allergy symptoms (allergens). These include animal skin flakes (dander) and pollen from trees or grass.  Things that pollute the air. These may include household cleaners, wood smoke, smog, or chemical odors.  Cold air, weather changes, and wind.  Crying or laughing hard.  Stress.  Certain medicines or drugs.  Certain foods such as dried fruit, potato chips, and grape juice.  Infections, such as a cold or the flu.  Certain medical conditions or diseases.  Exercise or tiring activities. Asthma may be treated with medicines and by staying away from the things that cause asthma attacks. Types of medicines may include:  Controller medicines. These help prevent asthma symptoms. They are usually taken every day.  Fast-acting reliever or rescue medicines. These quickly relieve asthma symptoms. They are used as needed and provide short-term relief.  Allergy medicines if your attacks are brought on by allergens.  Medicines to help control the body's defense (immune) system. Follow these instructions at home: Avoiding triggers in your home  Change your heating and air conditioning filter often.  Limit your use of fireplaces and wood stoves.  Get rid of pests (such as roaches and mice) and their droppings.  Throw away plants if you see mold on them.  Clean your floors. Dust regularly. Use cleaning products that do not smell.  Have someone vacuum when you are not home. Use a vacuum cleaner with a HEPA filter if possible.  Replace carpet with wood, tile, or vinyl flooring. Carpet can trap animal skin flakes and dust.  Use allergy-proof pillows, mattress covers, and box spring covers.  Wash bed sheets and blankets every week in hot water. Dry them in a dryer.  Keep your bedroom free of any triggers.  Avoid pets and  keep windows closed when things that cause allergy symptoms are in the air.  Use blankets that are made of polyester or cotton.  Clean bathrooms and kitchens with bleach. If possible, have someone repaint the walls in these rooms with mold-resistant paint. Keep out of the rooms that are being cleaned and painted.  Wash your hands often with soap and water. If soap and water are not available, use hand sanitizer.  Do not allow anyone to smoke in your home. General instructions  Take over-the-counter and prescription medicines only as told by your doctor. ? Talk with your doctor if you have questions about how or when to take your medicines. ? Make note if you need to use your medicines more often than usual.  Do not use any products that contain nicotine or tobacco, such as cigarettes and e-cigarettes. If you need help quitting, ask your doctor.  Stay away from secondhand smoke.  Avoid doing things outdoors when allergen counts are high and when air quality is low.  Wear a ski mask when doing outdoor activities in the winter. The mask should cover your nose and mouth. Exercise indoors on cold days if you can.  Warm up before you exercise. Take time to cool down after exercise.  Use a peak flow meter as told by your doctor. A peak flow meter is a tool that measures how well the lungs are working.  Keep track of the peak flow meter's readings. Write them down.  Follow your asthma action plan. This is a written plan for taking care of your asthma and treating your attacks.  Make sure you get all the shots (vaccines) that your doctor recommends. Ask your doctor about a flu shot and a pneumonia shot.  Keep all follow-up visits as told by your doctor. This is important. Contact a doctor if:  You have wheezing, shortness of breath, or a cough even while taking medicine to prevent attacks.  The mucus you cough up (sputum) is thicker than usual.  The mucus you cough up changes from  clear or white to yellow, green, gray, or bloody.  You have problems from the medicine you are taking, such as: ? A rash. ? Itching. ? Swelling. ? Trouble breathing.  You need reliever medicines more than 2-3 times a week.  Your peak flow reading is still at 50-79% of your personal best after following the action plan for 1 hour.  You have a fever. Get help right away if:  You seem to be worse and are not responding to medicine during an asthma attack.  You are short of breath even at rest.  You get short of breath when doing very little activity.  You have trouble eating, drinking, or talking.  You have chest pain or tightness.  You have a fast heartbeat.  Your lips or fingernails start to turn blue.  You are light-headed or dizzy, or you faint.  Your peak flow is less than 50% of your personal best.  You feel too tired to breathe normally. Summary  Asthma is a long-term (chronic) condition in which the airways get tight and narrow. An asthma attack can make it hard to breathe.  Asthma cannot be cured, but medicines and lifestyle changes can help control it.  Make sure you understand how to avoid triggers and how and when to use your medicines. This information is not intended to replace advice given to you by your health care provider. Make sure you discuss any questions you have with your health care provider. Document Released: 03/21/2008 Document Revised: 12/06/2018 Document Reviewed: 11/07/2016 Elsevier Patient Education  2020 [employer].

## 2019-06-26 NOTE — Progress Notes (Signed)
Virtual Visit via Telephone Note  I connected with Jenna Combs on 06/26/19 at  1:50 PM EDT by telephone and verified that I am speaking with the correct person using two identifiers.   I discussed the limitations, risks, security and privacy concerns of performing an evaluation and management service by telephone and the availability of in person appointments. I also discussed with the patient that there may be a patient responsible charge related to this service. The patient expressed understanding and agreed to proceed.  Patient Location: Home Provider Location: [facility] office Others participating in call: Call initiated by [place], CMA who then transferred the call to me   History of Present Illness:      28 yo female new to the practice.  Patient reports that she has a history of asthma  but she has not had a primary care physician and therefore she has had to go to the emergency room to receive refills of her medications.  She has really only been using an albuterol inhaler or albuterol nebulizer solution to help with her asthma.  She reports that she does have to use her inhaler or nebulizer solution on most days as she does tend to have issues with shortness of breath with asthma triggers such as hot weather, cold weather or if she has a respiratory infection.  She also on most nights has symptoms of either chest tightness, cough or sensation of mild wheezing.  She does continue to smoke cigarettes at this time.        She additionally reports issues with chronic midline low back pain without radiation.  She believes that her issues with back pain started after received an epidural for childbirth a few years ago.  Patient also states that her job requires her to stand for long hours which also causes her to have increased low back pain by the end of the day.        She reports that she has also gained weight which she believes is related to the COVID-19 pandemic as she has been at  home more which is led to increased eating/snacking.  Since gaining weight, she is starting to notice a drainage from the area above her rectum that is within the gluteal cleft.  Patient states that when she was about 10 years ago she required surgery to stop drainage from occurring in this area but she is not sure of the exact diagnosis.  Patient states that the drainage is usually somewhat thin and brownish to yellow and occasionally slightly red or bloody looking.  Area is not tender at this time.  She denies any recent fever or chills.  She denies any pelvic or rectal pain.  No abdominal pain-no nausea/vomiting/diarrhea or constipation.  No blood in the stool or black stools.  She denies any urinary symptoms such as urinary incontinence/leakage.  No burning with urination and no frequent urination.  She does have some mild fatigue.  No chest pain or palpitations.  No loss of appetite and no night sweats.        Past Medical History:  Diagnosis Date  . Asthma   . Seizures ([place])     Past Surgical History:  Procedure Laterality Date  . BRAIN TUMOR EXCISION     As a child    Family History  Problem Relation Age of Onset  . Diabetes Mother   . Hypertension Father     Social History   Tobacco Use  . Smoking status: Current  Every Day Smoker    Packs/day: 0.25    Years: 0.00    Pack years: 0.00    Types: Cigarettes  . Smokeless tobacco: Never Used  Substance Use Topics  . Alcohol use: No  . Drug use: No     Allergies  Allergen Reactions  . Mold Extract [Trichophyton] Other (See Comments)    Sneezing, runny nose       Observations/Objective: No vital signs or physical exam conducted as visit was done via telephone  Assessment and Plan: 1. Moderate persistent asthma without complication; 4.  Tobacco use Per her description, patient appears to have moderate persistent asthma.  She is not currently on any controller medications.  Discussed with patient that I would like for her to  start Flovent 2 puffs into the lungs 2 times daily.  She believes that she did use this medication when she was younger.  She is also provided with refills of albuterol inhaler and albuterol nebulizer solution to use as needed.  The importance of complete smoking cessation was also stressed.  Patient was asked to follow-up in 4 to 6 weeks to make sure that the medication is helping to decrease her day and nighttime symptoms of chest tightness, shortness of breath and wheezing.  Patient would also like a nebulizer and she will be able to pick this up from our office unless she would like this sent to a different home health company/DME provider. - fluticasone (FLOVENT HFA) 110 MCG/ACT inhaler; Inhale 2 puffs into the lungs 2 (two) times daily.  Dispense: 1 Inhaler; Refill: 5 - albuterol (VENTOLIN HFA) 108 (90 Base) MCG/ACT inhaler; Inhale 1-2 puffs into the lungs every 6 (six) hours as needed for wheezing or shortness of breath.  Dispense: 18 g; Refill: 5 - albuterol (PROVENTIL) (2.5 MG/3ML) 0.083% nebulizer solution; Take 3 mLs (2.5 mg total) by nebulization every 6 (six) hours as needed for wheezing or shortness of breath.  Dispense: 200 mL; Refill: 5 - For home use only DME Nebulizer machine  2. Abscess of gluteal cleft Patient will be referred to general surgery.  I discussed with the patient that she likely has a gluteal abscess and even though there is only a small opening on the outer surface of the skin that she needs to be evaluated in case there may be a larger abscess/tracking of infection deeper into the gluteal/pelvic region. - Ambulatory referral to General Surgery  3. Chronic midline low back pain without sciatica Patient reports greater than 2 years of chronic low back pain without radiation.  On review of chart, patient has had x-ray of the lumbar spine done 09/13/2017 at the emergency department which showed no acute bony pathology but patient did have some mild facet arthropathy at L4-5  and L5-S1.  Patient may take low-dose over-the-counter nonsteroidal anti-inflammatories if this does not interfere with or worsen her asthma, otherwise she may take over-the-counter Tylenol arthritis strength.  She is also encouraged to use warm moist heat to the area as well as stretching exercises.  She should call or return for further evaluation if back pain is not improving or worsens.  She declines physical therapy referral at this time.  Follow Up Instructions:Return in about 4 weeks (around 07/24/2019) for asthma.    I discussed the assessment and treatment plan with the patient. The patient was provided an opportunity to ask questions and all were answered. The patient agreed with the plan and demonstrated an understanding of the instructions.   The patient  was advised to call back or seek an in-person evaluation if the symptoms worsen or if the condition fails to improve as anticipated.  I provided 17 minutes of non-face-to-face time during this encounter.   Cain Saupeammie Jaeanna Mccomber, MD

## 2019-06-26 NOTE — Progress Notes (Signed)
Patient verified DOB Patient has eaten today. Patient has taken medication today. Patient complains of chronic back pain from having an epidural and standing for her work for 2 years now.  Patient complains of drainage from the crack of her bottom from gaining weight. Patient states she had surgery there when she was 8 (unable to explain the surgery) and feels as if the area is opening again or she is wiping too hard.

## 2019-07-24 ENCOUNTER — Other Ambulatory Visit: Payer: Self-pay

## 2019-07-24 ENCOUNTER — Ambulatory Visit: Payer: Medicaid Other | Attending: Family Medicine | Admitting: Family Medicine

## 2019-07-24 ENCOUNTER — Encounter: Payer: Self-pay | Admitting: Family Medicine

## 2019-07-24 DIAGNOSIS — L0231 Cutaneous abscess of buttock: Secondary | ICD-10-CM

## 2019-07-24 DIAGNOSIS — J4540 Moderate persistent asthma, uncomplicated: Secondary | ICD-10-CM

## 2019-07-24 DIAGNOSIS — R41840 Attention and concentration deficit: Secondary | ICD-10-CM | POA: Diagnosis not present

## 2019-07-24 DIAGNOSIS — R5383 Other fatigue: Secondary | ICD-10-CM | POA: Diagnosis not present

## 2019-07-24 NOTE — Progress Notes (Signed)
Virtual Visit via Telephone Note  I connected with Jenna Combs on 07/24/19 at  2:50 PM EDT by telephone and verified that I am speaking with the correct person using two identifiers.   I discussed the limitations, risks, security and privacy concerns of performing an evaluation and management service by telephone and the availability of in person appointments. I also discussed with the patient that there may be a patient responsible charge related to this service. The patient expressed understanding and agreed to proceed.  Patient Location: Home Provider Location: [facility] Others participating in call: call initiated by Emilio Aspen, RMA, who then transferred the call to me   History of Present Illness:       28 yo female seen in follow-up of moderate persistent asthma. She has not noticed a difference in her Asthma with the use of Flovent.  She reports that she continues to have issues with shortness of breath with exertion as well as some nighttime awakening with shortness of breath or cough.  She has had no recent wheezing.  She also reports continued issues with fatigue.         She reports that she did not hear anything regarding surgical referral from last visit regarding her recurrent issues with drainage of fluid from an area within the gluteal cleft above the rectum.  She does have some occasional tenderness in this area.            She continues to have some lower back pain but denies any dysuria or urinary frequency.  Low back pain has been longstanding. She has tried taking nonsteroidal anti-inflammatories and this has helped and she does not believe that it affects her asthma.         She also feels as if she may have issues with focus and concentration.  She believes that she may have adult ADHD.  She wonders if medication would help.    Past Medical History:  Diagnosis Date  . Asthma   . Seizures ([facility])     Past Surgical History:  Procedure Laterality Date  .  BRAIN TUMOR EXCISION     As a child    Family History  Problem Relation Age of Onset  . Diabetes Mother   . Hypertension Father     Social History   Tobacco Use  . Smoking status: Current Every Day Smoker    Packs/day: 0.25    Years: 0.00    Pack years: 0.00    Types: Cigarettes  . Smokeless tobacco: Never Used  Substance Use Topics  . Alcohol use: No  . Drug use: No     Allergies  Allergen Reactions  . Mold Extract [Trichophyton] Other (See Comments)    Sneezing, runny nose       Observations/Objective: No vital signs or physical exam conducted as visit was done via telephone  Assessment and Plan: 1. Moderate persistent asthma without complication She reports continued asthma symptoms and that Flonase did not seem to make a difference in her symptoms.  Patient will be referred to pulmonology for further evaluation and treatment of her moderate persistent asthma.   - Ambulatory referral to Pulmonology  2. Fatigue, unspecified type Patient with fatigue and has been asked to come into the office to have lab work including hemoglobin A1c, TSH and BMP in follow-up of her fatigue to look for possible prediabetes or diabetes, hypothyroidism or elevated glucose/electrolyte abnormality in follow-up of her fatigue. - Hemoglobin A1c; Future - TSH; Future -  BMP; Future  3. Abscess of gluteal cleft Patient reports that she continues to have leakage from an area within her gluteal cleft.  She will be referred to general surgery for further evaluation - Ambulatory referral to General Surgery  4. Lack of concentration She believes that she may have ADHD.  She will be referred to psychiatry for further evaluation and treatment as needed - Ambulatory referral to Psychiatry  Follow Up Instructions: as needed and 3-4 months    I discussed the assessment and treatment plan with the patient. The patient was provided an opportunity to ask questions and all were answered. The patient  agreed with the plan and demonstrated an understanding of the instructions.   The patient was advised to call back or seek an in-person evaluation if the symptoms worsen or if the condition fails to improve as anticipated.  I provided 16 minutes of non-face-to-face time during this encounter.   Cain Saupe, MD

## 2019-07-26 ENCOUNTER — Other Ambulatory Visit: Payer: Medicaid Other

## 2020-02-11 ENCOUNTER — Encounter (HOSPITAL_COMMUNITY): Payer: Self-pay

## 2020-02-11 ENCOUNTER — Ambulatory Visit (HOSPITAL_COMMUNITY)
Admission: EM | Admit: 2020-02-11 | Discharge: 2020-02-11 | Disposition: A | Discharge status: Home / Self Care | Payer: Medicaid Other | Attending: Family Medicine | Admitting: Family Medicine

## 2020-02-11 ENCOUNTER — Other Ambulatory Visit: Payer: Self-pay

## 2020-02-11 DIAGNOSIS — L853 Xerosis cutis: Secondary | ICD-10-CM

## 2020-02-11 MED ORDER — AMMONIUM LACTATE 12 % EX CREA: TOPICAL_CREAM | CUTANEOUS | 0 refills | Status: DC | PRN

## 2020-02-11 NOTE — ED Triage Notes (Signed)
Patient reports a cracked heel on her left foot. Reports it has been there as long as she can remember, and is now developing one on the right heel as well. Reports that she has tried moisturizing it with no success. States it is too painful to walk on.

## 2020-02-11 NOTE — Discharge Instructions (Addendum)
Soak and exfoliate with pumice stone or file daily Use lac hydrin 2 x a day Wear under socks at night May see podiatry if fails to improve

## 2020-02-11 NOTE — ED Provider Notes (Signed)
[place]    CSN: [id number] Arrival date & time: 02/11/20  1051      History   Chief Complaint Chief Complaint  Patient presents with  . Foot Pain    HPI Jenna Combs is a 29 y.o. female.   HPI  Patient is here for cracked heels.  She has been treating them with moisture and debridement.  In spite of this she still has painful cracks in her heels.  Today her left heel is so painful she can hardly bear weight.  She could not get into her work boots.  She needs a note and advice on how to treat this condition.  Past Medical History:  Diagnosis Date  . Asthma   . Seizures [facility])     Patient Active Problem List   Diagnosis Date Noted  . Intermittent asthma without complication 85/63/1497    Past Surgical History:  Procedure Laterality Date  . BRAIN TUMOR EXCISION     As a child    OB History   No obstetric history on file.      Home Medications    Prior to Admission medications   Medication Sig Start Date End Date Taking? Authorizing Provider  acetaminophen (TYLENOL) 500 MG tablet Take 500-1,000 mg by mouth every 6 (six) hours as needed for mild pain or moderate pain.    [provider]  albuterol (PROVENTIL) (2.5 MG/3ML) 0.083% nebulizer solution Take 3 mLs (2.5 mg total) by nebulization every 6 (six) hours as needed for wheezing or shortness of breath. 06/26/19   Fulp, Cammie, MD  albuterol (VENTOLIN HFA) 108 (90 Base) MCG/ACT inhaler Inhale 1-2 puffs into the lungs every 6 (six) hours as needed for wheezing or shortness of breath. 06/26/19   Fulp, Cammie, MD  ammonium lactate (LAC-HYDRIN) 12 % cream Apply topically as needed for dry skin. 02/11/20   Raylene Everts, MD  fluticasone (FLOVENT HFA) 110 MCG/ACT inhaler Inhale 2 puffs into the lungs 2 (two) times daily. 06/26/19   Fulp, Cammie, MD  montelukast (SINGULAIR) 10 MG tablet Take 1 tablet (10 mg total) by mouth at bedtime for 30 days. 01/17/19 07/24/19  Chase Picket, MD     Family History Family History  Problem Relation Age of Onset  . Diabetes Mother   . Hypertension Father     Social History Social History   Tobacco Use  . Smoking status: Current Every Day Smoker    Packs/day: 0.25    Years: 0.00    Pack years: 0.00    Types: Cigarettes  . Smokeless tobacco: Never Used  Substance Use Topics  . Alcohol use: No  . Drug use: No     Allergies   Mold extract [trichophyton]   Review of Systems Review of Systems  Skin: Positive for wound.     Physical Exam Triage Vital Signs ED Triage Vitals  Enc Vitals Group     BP 02/11/20 1136 109/76     Pulse Rate 02/11/20 1136 76     Resp 02/11/20 1136 16     Temp 02/11/20 1136 98.5 F (36.9 C)     Temp Source 02/11/20 1136 Oral     SpO2 02/11/20 1136 100 %     Weight --      Height --      Head Circumference --      Peak Flow --      Pain Score 02/11/20 1134 10     Pain Loc --  Pain Edu? --      Excl. in GC? --    No data found.  Updated Vital Signs BP 109/76 (BP Location: Right Arm)   Pulse 76   Temp 98.5 F (36.9 C) (Oral)   Resp 16   SpO2 100%     Physical Exam Constitutional:      General: She is not in acute distress.    Appearance: She is well-developed. She is obese.  HENT:     Head: Normocephalic and atraumatic.  Eyes:     Conjunctiva/sclera: Conjunctivae normal.     Pupils: Pupils are equal, round, and reactive to light.  Cardiovascular:     Rate and Rhythm: Normal rate.  Pulmonary:     Effort: Pulmonary effort is normal. No respiratory distress.  Musculoskeletal:        General: Normal range of motion.     Cervical back: Normal range of motion.  Skin:    General: Skin is warm and dry.     Comments: Both heels have deep fissures.  Some thickening of the heel skin.  No evidence of redness, drainage, infection.   Neurological:     Mental Status: She is alert.     Gait: Gait abnormal.  Psychiatric:        Mood and Affect: Mood normal.         Behavior: Behavior normal.      UC Treatments / Results  Labs (all labs ordered are listed, but only abnormal results are displayed) Labs Reviewed - No data to display  EKG   Radiology No results found.  Procedures Procedures (including critical care time)  Medications Ordered in UC Medications - No data to display  Initial Impression / Assessment and Plan / UC Course  I have reviewed the triage vital signs and the nursing notes.  Pertinent labs & imaging results that were available during my care of the patient were reviewed by me and considered in my medical decision making (see chart for details).     The deepest heel is closed with dermabond Skin care discussed Final Clinical Impressions(s) / UC Diagnoses   Final diagnoses:  Dry skin     Discharge Instructions     Soak and exfoliate with pumice stone or file daily Use lac hydrin 2 x a day Wear under socks at night May see podiatry if fails to improve    ED Prescriptions    Medication Sig Dispense Auth. Provider   ammonium lactate (LAC-HYDRIN) 12 % cream Apply topically as needed for dry skin. 385 g Eustace Moore, MD     PDMP not reviewed this encounter.   Eustace Moore, MD 02/11/20 [phone]

## 2020-02-12 ENCOUNTER — Ambulatory Visit: Payer: Medicaid Other | Admitting: Podiatry

## 2020-02-12 DIAGNOSIS — L309 Dermatitis, unspecified: Secondary | ICD-10-CM

## 2020-02-12 NOTE — Progress Notes (Signed)
Subjective:   Patient ID: Jenna Combs, female   DOB: 29 y.o.   MRN: [id number]   HPI Patient has long-term history of cracking of the left posterior heel and slight on the right.  Patient has family history with his mother also having trouble with skin and states it is quite sore and makes it hard to be comfortable with her foot.  Patient does smoke quarter pack per day and would like to be active   Review of Systems  All other systems reviewed and are negative.       Objective:  Physical Exam Vitals and nursing note reviewed.  Constitutional:      Appearance: She is well-developed.  Pulmonary:     Effort: Pulmonary effort is normal.  Musculoskeletal:        General: Normal range of motion.  Skin:    General: Skin is warm.  Neurological:     Mental Status: She is alert.     Neurovascular status found to be intact muscle strength found to be adequate range of motion within normal limits.  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  Discussed utilizing topical medications Vaseline with occlusion and adhesives and explained that it could be very difficult to completely solve his problem.  Patient will be seen back as needed

## 2020-05-20 ENCOUNTER — Emergency Department (HOSPITAL_COMMUNITY)
Admission: EM | Admit: 2020-05-20 | Discharge: 2020-05-21 | Disposition: A | Discharge status: Home / Self Care | Payer: Medicaid Other | Attending: Emergency Medicine | Admitting: Emergency Medicine

## 2020-05-20 ENCOUNTER — Encounter (HOSPITAL_COMMUNITY): Payer: Self-pay | Admitting: Emergency Medicine

## 2020-05-20 ENCOUNTER — Other Ambulatory Visit: Payer: Self-pay

## 2020-05-20 DIAGNOSIS — Z5321 Procedure and treatment not carried out due to patient leaving prior to being seen by health care provider: Secondary | ICD-10-CM | POA: Diagnosis not present

## 2020-05-20 DIAGNOSIS — M7138 Other bursal cyst, other site: Secondary | ICD-10-CM | POA: Diagnosis not present

## 2020-05-20 LAB — BASIC METABOLIC PANEL
Anion gap: 8 (ref 5–15)
BUN: 5 mg/dL — ABNORMAL LOW (ref 6–20)
CO2: 23 mmol/L (ref 22–32)
Calcium: 8.8 mg/dL — ABNORMAL LOW (ref 8.9–10.3)
Chloride: 98 mmol/L (ref 98–111)
Creatinine, Ser: 0.81 mg/dL (ref 0.44–1.00)
GFR calc Af Amer: >60 mL/min (ref >60)
GFR calc non Af Amer: >60 mL/min (ref >60)
Glucose, Bld: 92 mg/dL (ref 70–99)
Potassium: 3.2 mmol/L — ABNORMAL LOW (ref 3.5–5.1)
Sodium: 129 mmol/L — ABNORMAL LOW (ref 135–145)

## 2020-05-20 LAB — CBC
HCT: 35.8 % — ABNORMAL LOW (ref 36.0–46.0)
Hemoglobin: 11.4 g/dL — ABNORMAL LOW (ref 12.0–15.0)
MCH: 28.8 pg (ref 26.0–34.0)
MCHC: 31.8 g/dL (ref 30.0–36.0)
MCV: 90.4 fL (ref 80.0–100.0)
Platelets: 313 10*3/uL (ref 150–400)
RBC: 3.96 MIL/uL (ref 3.87–5.11)
RDW: 14.7 % (ref 11.5–15.5)
WBC: 6.3 10*3/uL (ref 4.0–10.5)
nRBC: 0 % (ref 0.0–0.2)

## 2020-05-20 NOTE — ED Triage Notes (Signed)
Pt presents to ED POV. Pt c/o cyst on L side of back that began a few days ago. Cyst is red and inflamed. Pt reports hx of same cysts in past. Pt reports no neuro change, no fevers noted at home.

## 2020-05-21 ENCOUNTER — Ambulatory Visit (HOSPITAL_COMMUNITY)
Admission: EM | Admit: 2020-05-21 | Discharge: 2020-05-21 | Disposition: A | Discharge status: Home / Self Care | Payer: Medicaid Other | Attending: Physician Assistant | Admitting: Physician Assistant

## 2020-05-21 DIAGNOSIS — L0291 Cutaneous abscess, unspecified: Secondary | ICD-10-CM | POA: Diagnosis not present

## 2020-05-21 DIAGNOSIS — Z3202 Encounter for pregnancy test, result negative: Secondary | ICD-10-CM | POA: Diagnosis not present

## 2020-05-21 LAB — POC URINE PREG, ED: Preg Test, Ur: NEGATIVE

## 2020-05-21 MED ORDER — LIDOCAINE-EPINEPHRINE 1 %-1:100000 IJ SOLN
INTRAMUSCULAR | Status: AC
  Filled 2020-05-21: qty 1

## 2020-05-21 MED ORDER — DOXYCYCLINE HYCLATE 100 MG PO CAPS: 100.0000 mg | ORAL_CAPSULE | Freq: Two times a day (BID) | ORAL | 0 refills | Status: AC

## 2020-05-21 NOTE — ED Triage Notes (Signed)
Pt c/o abscess to back x 5 days, states is draining

## 2020-05-21 NOTE — ED Provider Notes (Signed)
[facility]    CSN: [id number] Arrival date & time: 05/21/20  1519      History   Chief Complaint Chief Complaint  Patient presents with   Abscess    HPI Jenna Combs is a 29 y.o. female.   Patient reports for abscess on left side of back and side.  She reports is been present for 5 days.  She reports she is started to drain.  Reports is quite painful.  She reports she went to the emergency department yesterday but did not stay to get seen.  She has had these before.  Denies fever or chills.  Reports she otherwise feels well.     Past Medical History:  Diagnosis Date   Asthma    Seizures [facility])     Patient Active Problem List   Diagnosis Date Noted   Intermittent asthma without complication 09/25/2018    Past Surgical History:  Procedure Laterality Date   BRAIN TUMOR EXCISION     As a child    OB History   No obstetric history on file.      Home Medications    Prior to Admission medications   Medication Sig Start Date End Date Taking? Authorizing Provider  acetaminophen (TYLENOL) 500 MG tablet Take 500-1,000 mg by mouth every 6 (six) hours as needed for mild pain or moderate pain.    [provider]  albuterol (PROVENTIL) (2.5 MG/3ML) 0.083% nebulizer solution Take 3 mLs (2.5 mg total) by nebulization every 6 (six) hours as needed for wheezing or shortness of breath. 06/26/19   Fulp, Cammie, MD  albuterol (VENTOLIN HFA) 108 (90 Base) MCG/ACT inhaler Inhale 1-2 puffs into the lungs every 6 (six) hours as needed for wheezing or shortness of breath. 06/26/19   Fulp, Cammie, MD  ammonium lactate (LAC-HYDRIN) 12 % cream Apply topically as needed for dry skin. 02/11/20   Eustace Moore, MD  doxycycline (VIBRAMYCIN) 100 MG capsule Take 1 capsule (100 mg total) by mouth 2 (two) times daily for 7 days. 05/21/20 05/28/20  Hazle Ogburn, Veryl Speak, PA-C  fluticasone (FLOVENT HFA) 110 MCG/ACT inhaler Inhale 2 puffs into the lungs 2 (two) times daily. 06/26/19    Fulp, Cammie, MD  montelukast (SINGULAIR) 10 MG tablet Take 1 tablet (10 mg total) by mouth at bedtime for 30 days. 01/17/19 07/24/19  LampteyBritta Mccreedy, MD    Family History Family History  Problem Relation Age of Onset   Diabetes Mother    Hypertension Father     Social History Social History   Tobacco Use   Smoking status: Current Every Day Smoker    Packs/day: 0.25    Years: 0.00    Pack years: 0.00    Types: Cigarettes   Smokeless tobacco: Never Used  Vaping Use   Vaping Use: Never used  Substance Use Topics   Alcohol use: No   Drug use: No     Allergies   Mold extract [trichophyton]   Review of Systems Review of Systems   Physical Exam Triage Vital Signs ED Triage Vitals [05/21/20 1532]  Enc Vitals Group     BP 105/66     Pulse Rate 88     Resp 16     Temp 98.6 F (37 C)     Temp src      SpO2 100 %     Weight      Height      Head Circumference      Peak Flow  Pain Score      Pain Loc      Pain Edu?      Excl. in GC?    No data found.  Updated Vital Signs BP 105/66    Pulse 88    Temp 98.6 F (37 C)    Resp 16    LMP 05/03/2020    SpO2 100%   Visual Acuity Right Eye Distance:   Left Eye Distance:   Bilateral Distance:    Right Eye Near:   Left Eye Near:    Bilateral Near:     Physical Exam Vitals and nursing note reviewed.  Constitutional:      Appearance: Normal appearance.  Skin:         Comments: 5-6cm in diameter abscess without surrounding erythema  Neurological:     Mental Status: She is alert.      UC Treatments / Results  Labs (all labs ordered are listed, but only abnormal results are displayed) Labs Reviewed  POC URINE PREG, ED    EKG   Radiology No results found.  Procedures Incision and Drainage  Date/Time: 05/21/2020 8:38 PM Performed by: Hermelinda Medicus, PA-C Authorized by: Hermelinda Medicus, PA-C   Consent:    Consent obtained:  Verbal   Consent given by:  Patient   Risks discussed:   Incomplete drainage and pain   Alternatives discussed:  Alternative treatment Location:    Type:  Abscess   Size:  6 cm   Location:  Trunk   Trunk location:  Back (Left back, nearing posterior axillary line) Pre-procedure details:    Skin preparation:  Antiseptic wash and Betadine Anesthesia (see MAR for exact dosages):    Anesthesia method:  Local infiltration   Local anesthetic:  Lidocaine 1% WITH epi Procedure details:    Incision types:  Single straight   Scalpel blade:  11   Wound management:  Probed and deloculated   Drainage:  Bloody and purulent   Drainage amount:  Copious   Wound treatment:  Wound left open   Packing materials:  None Post-procedure details:    Patient tolerance of procedure:  Tolerated well, no immediate complications   (including critical care time)  Medications Ordered in UC Medications - No data to display  Initial Impression / Assessment and Plan / UC Course  I have reviewed the triage vital signs and the nursing notes.  Pertinent labs & imaging results that were available during my care of the patient were reviewed by me and considered in my medical decision making (see chart for details).     #Abscess of left posterior axillary line Patient is a 29 year old presenting with abscess of the left posterior axillary line.  Successful incision and drainage.  Will place on doxycycline.  I discussed the patient's recent labs emergency department and informed her that her sodium was low, offered to retest this however patient declines blood draw.  Encouraged to drink electrolyte beverages.  Encouraged her to follow up with her primary care for lab retest.  She verbalized agreement understanding.  Discussed return and follow-up precautions. Final Clinical Impressions(s) / UC Diagnoses   Final diagnoses:  Abscess     Discharge Instructions     Take the medicine as prescribed.  You should follow-up with your primary care for the recent labs at the  emergency department, as she did not want me to recheck these today.  If there is reaccumulation or not improving over the next 2 to 3 days return  for reevaluation.  Gently express and massage the area while showering.  Change bandages daily.  Apply topical antibiotic ointment.      ED Prescriptions    Medication Sig Dispense Auth. Provider   doxycycline (VIBRAMYCIN) 100 MG capsule Take 1 capsule (100 mg total) by mouth 2 (two) times daily for 7 days. 14 capsule Hasnain Manheim, Veryl Speak, PA-C     PDMP not reviewed this encounter.   Hermelinda Medicus, PA-C 05/21/20 2041

## 2020-05-21 NOTE — ED Notes (Signed)
Called pt name x4 to be roomed. No response from pt.  

## 2020-05-21 NOTE — Discharge Instructions (Signed)
Take the medicine as prescribed.  You should follow-up with your primary care for the recent labs at the emergency department, as she did not want me to recheck these today.  If there is reaccumulation or not improving over the next 2 to 3 days return for reevaluation.  Gently express and massage the area while showering.  Change bandages daily.  Apply topical antibiotic ointment.

## 2020-05-22 ENCOUNTER — Telehealth: Payer: Self-pay | Admitting: *Deleted

## 2020-05-22 ENCOUNTER — Telehealth: Payer: Self-pay

## 2020-05-22 NOTE — Telephone Encounter (Signed)
Returned pt phone call and left voice mail to schedule an appt.    Copied from CRM [id number] [id number]. Topic: Appointment Scheduling - Scheduling Inquiry for Clinic >> May 22, 2020  1:08 PM Stephannie Li wrote: Reason for CRM: Patient has been in the ED on 05/20/20 and also 05/21/2020 ,and will need a follow up visit, there are none available soon,please call her to schedule.                                                          Thank you

## 2020-05-22 NOTE — Telephone Encounter (Signed)
Medicaid Managed Care team Transition of Care Assessment outreach attempt #1 made today. Unable to reach patient. HIPPA compliant voice message left requesting a return call. The patient has also been enrolled in an automated discharge follow up call series and will receive two outreach attempts for transition of care assessment. Contact information has been left for the patient and the Medicaid Managed Care team is available to provide assistance to the patient at any time.  ° °Katrice Detrich Rakestraw, RN, BSN, CCRN °Patient Engagement Center °[phone] ° °

## 2020-05-22 NOTE — Telephone Encounter (Signed)
Contacted pt to complete transition of care assessment:  Transition Care Management Follow-up Telephone Call   [facility] Managed Care Transition Call Status:[facility] Call Made   Date of discharge and from where: [facility], 05/21/20   How have you been since you were released from the hospital? "much better"   Any questions or concerns? No  Items Reviewed:  Did the pt receive and understand the discharge instructions provided? Yes   Medications obtained and verified? No pt has not picked up medication yet. Pt states she is on the way  Any new allergies since your discharge? No   Dietary orders reviewed? No  Do you have support at home?  Yes, family  Functional Questionnaire: (I = Independent and D = Dependent)  ADLs: Independent Bathing/Dressing:Independent Meal Prep: Independent Eating: Independent Maintaining continence: Independent Transferring/Ambulation: Independent Managing Meds: Independent Follow up appointments reviewed:  PCP Hospital f/u appt confirmed? No  pt states she has already been contacted today for assignment for a PCP  Specialist Hospital f/u appt confirmed? N/a  Are transportation arrangements needed? No   If their condition worsens, is the pt aware to call PCP or go to the EmergencyDept.? Yes  Was the patient provided with contact information for the PCP's office or ED? Yes  Was to pt encouraged to call back with questions or concerns? Yes  Burnard Bunting, RN, BSN, CCRN Patient Engagement Center [phone]

## 2020-10-28 ENCOUNTER — Other Ambulatory Visit: Payer: Self-pay

## 2020-10-28 ENCOUNTER — Ambulatory Visit: Payer: Medicaid Other | Admitting: Family Medicine

## 2020-10-28 VITALS — BP 122/74 | HR 85 | Wt 243.6 lb

## 2020-10-28 DIAGNOSIS — Z720 Tobacco use: Secondary | ICD-10-CM | POA: Diagnosis not present

## 2020-10-28 DIAGNOSIS — J4520 Mild intermittent asthma, uncomplicated: Secondary | ICD-10-CM | POA: Diagnosis not present

## 2020-10-28 DIAGNOSIS — Z6841 Body Mass Index (BMI) 40.0 and over, adult: Secondary | ICD-10-CM | POA: Diagnosis not present

## 2020-10-28 DIAGNOSIS — L732 Hidradenitis suppurativa: Secondary | ICD-10-CM

## 2020-10-28 DIAGNOSIS — M545 Low back pain, unspecified: Secondary | ICD-10-CM | POA: Diagnosis not present

## 2020-10-28 DIAGNOSIS — H52532 Spasm of accommodation, left eye: Secondary | ICD-10-CM

## 2020-10-28 DIAGNOSIS — Z6830 Body mass index (BMI) 30.0-30.9, adult: Secondary | ICD-10-CM | POA: Diagnosis not present

## 2020-10-28 DIAGNOSIS — T148XXA Other injury of unspecified body region, initial encounter: Secondary | ICD-10-CM | POA: Diagnosis not present

## 2020-10-28 NOTE — Patient Instructions (Addendum)
We will complete blood work today to check your electrolyte levels to see if this could be a cause of your eye twitching.  I will also check blood work to see if there are any signs of infection due to your concern for wounds as well as also checking for any signs of anemia.  I do believe the areas are healed well and do not think they appear infected at this time.  Please follow-up with me in 2 to 3 weeks to address your concerns and follow-up on your lab work.  [email or web address]  Managing Chronic Back Pain Chronic back pain is back pain that lasts for 12 weeks or longer. It often affects the lower back. Back pain may feel like a muscle ache or a sharp, stabbing pain. It can be mild, moderate, or severe. If you have been diagnosed with chronic back pain, there are things you can do to manage your symptoms. You may have to try different things to see what works best for you. Your health care provider may also give you specific instructions. How to manage lifestyle changes Treating chronic back pain often starts with rest and pain relief, followed by exercises to restore movement and strength to your back (physical therapy). You may need surgery if other treatments do not help, or if your pain is caused by a condition or an injury. Follow your treatment plan as told by your health care provider. This may include:  Relaxation techniques.  Talk therapy or counseling with a mental health specialist. A form of talk therapy called cognitive behavioral therapy (CBT) can be especially helpful. This therapy helps you set goals and follow up on the changes that you make.  Acupuncture or massage therapy.  Local electrical stimulation.  Injections. These deliver numbing or pain-relieving medicines into your spine or the area of pain. How to recognize changes in your chronic back pain Your condition may improve with treatment. However, back pain may not go away or may get worse  over time. Watch your symptoms carefully and let your health care provider know if your symptoms get worse or do not improve. Your back pain may be getting worse if you have:  Pain that begins to cause problems with posture.  Pain that gets worse when you are sitting, standing, walking, bending, or lifting.  Pain that affects you while you are active, or at rest, or both.  Pain that eventually makes it hard to move around (limits mobility).  Pain that occurs with fever, weight loss, or difficulty urinating.  Pain that causes numbness and tingling. How to use body mechanics and posture to help with pain Healthy body mechanics and good posture can help to relieve stress on your back. Body mechanics refers to the movements and positions of your body during your daily activities. Posture is part of body mechanics. Good posture means:  Your spine is in its natural S-curve, or neutral, position.  Your shoulders are pulled back slightly.  Your head is not tipped forward. Follow these guidelines to improve your posture and body mechanics in your everyday activities. Standing  When standing, keep your spine neutral and your feet about hip-width apart. Keep your knees slightly bent. Your ears, shoulders, and hips should line up.  When you do a task in which you stand in one place for a long time, place one foot on a stable object that is 2-4 inches (5-10 cm) high, such as a footstool. This helps keep your spine neutral.   Sitting  When sitting, keep your spine neutral and your feet flat on the floor. Use a footrest, if necessary, and keep your thighs parallel to the floor. Avoid rounding your shoulders, and avoid tilting your head forward.  When working at a desk or a computer, keep your desk at a height where your hands are slightly lower than your elbows. Slide your chair under your desk so you are close enough to maintain good posture.  When working at a computer, place your monitor at a  height where you are looking straight ahead and you do not have to tilt your head forward or downward to view the screen.   Lifting  Keep your feet at least shoulder-width apart and tighten the muscles of your abdomen.  Bend your knees and hips and keep your spine neutral. Be sure to lift using the strength of your legs, not your back. Do not lock your knees straight out.  Always ask for help to lift heavy or awkward objects.   Resting  When lying down and resting, avoid positions that are most painful.  If you have pain with activities such as sitting, bending, stooping, or squatting, lie in a position in which your body does not bend very much. For example, avoid curling up on your side with your arms and knees near your chest (fetal position).  If you have pain with activities such as standing for a long time or reaching with your arms, lie with your spine in a neutral position and bend your knees slightly. Try: ? Lying on your side with a pillow between your knees. ? Lying on your back with a pillow under your knees.   Follow these instructions at home: Medicines  Treatment may include over-the-counter or prescription medicines for pain and inflammation that are taken by mouth or applied to the skin. Another treatment may include muscle relaxants. Take over-the-counter and prescription medicines only as told by your health care provider.  Ask your health care provider if the medicine prescribed to you: ? Requires you to avoid driving or using machinery. ? Can cause constipation. You may need to take these actions to prevent or treat constipation:  Drink enough fluid to keep your urine pale yellow.  Take over-the-counter or prescription medicines.  Eat foods that are high in fiber, such as beans, whole grains, and fresh fruits and vegetables.  Limit foods that are high in fat and processed sugars, such as fried or sweet foods. Lifestyle  Do not use any products that contain  nicotine or tobacco, such as cigarettes, e-cigarettes, and chewing tobacco. If you need help quitting, ask your health care provider.  Eat a healthy diet that includes foods such as vegetables, fruits, fish, and lean meats.  Work with your health care provider to achieve or maintain a healthy weight. General instructions  Get regular exercise as told. Exercise improves flexibility and strength.  If physical therapy was prescribed, do exercises as told by your health care provider.  Use ice or heat therapy as told by your health care provider.  Keep all follow-up visits as told by your health care provider. This is important. Where can I get support? Consider joining a support group for people managing chronic back pain. Ask your health care provider about support groups in your area. You can also find online and in-person support groups through:  The American Chronic Pain Association: theacpa.org  Pain Connection Program: painconnection.org Contact a health care provider if:  You have pain that is not  relieved with rest or medicine.  Your pain gets worse, or you have new pain.  You have a fever.  You have rapid weight loss.  You have trouble doing your normal activities. Get help right away if:  You have weakness or numbness in one or both of your legs or feet.  You have trouble controlling your bladder or your bowels.  You have severe back pain and have any of the following: ? Nausea or vomiting. ? Abdominal pain. ? Shortness of breath or you faint. Summary  Chronic back pain is often treated with rest, pain relief, and physical therapy.  Talk therapy, acupuncture, massage, and local electrical stimulation may help.  Follow your treatment plan as told by your health care provider.  Joining a support group may help you manage chronic back pain. This information is not intended to replace advice given to you by your health care provider. Make sure you discuss any  questions you have with your health care provider. Document Revised: 11/14/2019 Document Reviewed: 07/23/2019 Elsevier Patient Education  2021 [employer].

## 2020-10-28 NOTE — Progress Notes (Addendum)
    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   Asthma  Patient denies presence of nighttime symptoms and does not use Flovent for controller medication. She has not had an asthma exacerbation in years. She denies SOB or wheezing.   Back pain Patient also reports some back pain that occurs intermittently and is relieved with 1-2 tabs of Aleeve. She denies urinary/bowel incontinence, gait disturbance, fever, chills or leg weakness associated with back pain. Patient believes pain is related to work tasks.   Obesity  Patient reports she has gained a significant amount of weight and is interested in weight loss in order to improve her overall health. She reports several challenges including balancing work with taking care of her son.   Social Hx Patient is current daily smoker and reports smoking 4-5 cigarettes daily since age 73 She denies recreational drug use  Reports occasional alcohol consumption   PERTINENT  PMH / PSH:  Mild intermittent asthma Lumbar Back Pain   OBJECTIVE:   BP 122/74   Pulse 85   Wt 243 lb 9.6 oz (110.5 kg)   SpO2 96%   BMI 41.81 kg/m   General: female appearing stated age in no acute distress, appears tired  Cardio: Normal S1 and S2, no S3 or S4. Rhythm is regular. No murmurs or rubs.  Bilateral radial pulses palpable Pulm: Clear to auscultation bilaterally, no crackles, wheezing, or diminished breath sounds. Normal respiratory effort, stable on RA  Abdomen: Bowel sounds normal. Abdomen soft and non-tender.  Extremities: No peripheral edema. Warm & well perfused.  Neuro: pt alert and oriented x4, follows commands, PERRLA, EOMI bilaterally, strength 5/5 in bilateral upper and lower extremities  Skin: healed scars from hidradenitis suppurativa scars in gluteal  fold, no active drainage/erythema or tenderness to palpation, no edema, no streaking   ASSESSMENT/PLAN:   Lumbar pain Intermittent. Work related, no red flag symptoms.  Patient given exercises/stretches HO  Patient to continue OTC meds as needed for intermittent symptoms   Intermittent asthma without complication Asymptomatic today. Patient advised to notify this office of increased symptoms and need to restart controller therapy if this occurs.   Tobacco use Current daily smoker, report 4-5 cigarettes per day since age 69.  Will continue to encourage cessation   BMI 40.0-44.9, adult [facility]) Patient will need   Hidradenitis suppurativa No actively draining areas at this point. Will obtain CBC to look for any signs of inflammation as patient expresses concern for reoccurrences.   BMI 30.0-30.9,adult Patient reports significant weight gain in the past few years. Denies hx of MDD or thyroid issues.  Will check CMP     F/u 2-3 weeks for lab work   Ronnald Ramp, MD [facility]

## 2020-10-29 DIAGNOSIS — F4323 Adjustment disorder with mixed anxiety and depressed mood: Secondary | ICD-10-CM | POA: Diagnosis not present

## 2020-10-29 DIAGNOSIS — Z6841 Body Mass Index (BMI) 40.0 and over, adult: Secondary | ICD-10-CM | POA: Insufficient documentation

## 2020-10-29 DIAGNOSIS — Z720 Tobacco use: Secondary | ICD-10-CM | POA: Insufficient documentation

## 2020-10-29 DIAGNOSIS — M545 Low back pain, unspecified: Secondary | ICD-10-CM | POA: Insufficient documentation

## 2020-10-29 LAB — COMPREHENSIVE METABOLIC PANEL
ALT: 15 IU/L (ref 0–32)
AST: 18 IU/L (ref 0–40)
Albumin/Globulin Ratio: 1.2 (ref 1.2–2.2)
Albumin: 4 g/dL (ref 3.8–4.8)
Alkaline Phosphatase: 60 IU/L (ref 44–121)
BUN/Creatinine Ratio: 14 (ref 9–23)
BUN: 11 mg/dL (ref 6–20)
Bilirubin Total: 0.2 mg/dL (ref 0.0–1.2)
CO2: 22 mmol/L (ref 20–29)
Calcium: 9.3 mg/dL (ref 8.7–10.2)
Chloride: 105 mmol/L (ref 96–106)
Creatinine, Ser: 0.76 mg/dL (ref 0.57–1.00)
GFR calc Af Amer: 121 mL/min/{1.73_m2} (ref >59)
GFR calc non Af Amer: 105 mL/min/{1.73_m2} (ref >59)
Globulin, Total: 3.3 g/dL (ref 1.5–4.5)
Glucose: 96 mg/dL (ref 65–99)
Potassium: 4 mmol/L (ref 3.5–5.2)
Sodium: 141 mmol/L (ref 134–144)
Total Protein: 7.3 g/dL (ref 6.0–8.5)

## 2020-10-29 LAB — CBC WITH DIFFERENTIAL/PLATELET
Basophils Absolute: 0 10*3/uL (ref 0.0–0.2)
Basos: 1 %
EOS (ABSOLUTE): 0.1 10*3/uL (ref 0.0–0.4)
Eos: 2 %
Hematocrit: 36.2 % (ref 34.0–46.6)
Hemoglobin: 11.6 g/dL (ref 11.1–15.9)
Immature Grans (Abs): 0 10*3/uL (ref 0.0–0.1)
Immature Granulocytes: 0 %
Lymphocytes Absolute: 1.9 10*3/uL (ref 0.7–3.1)
Lymphs: 35 %
MCH: 29.2 pg (ref 26.6–33.0)
MCHC: 32 g/dL (ref 31.5–35.7)
MCV: 91 fL (ref 79–97)
Monocytes Absolute: 0.5 10*3/uL (ref 0.1–0.9)
Monocytes: 8 %
Neutrophils Absolute: 3 10*3/uL (ref 1.4–7.0)
Neutrophils: 54 %
Platelets: 284 10*3/uL (ref 150–450)
RBC: 3.97 x10E6/uL (ref 3.77–5.28)
RDW: 13.7 % (ref 11.7–15.4)
WBC: 5.6 10*3/uL (ref 3.4–10.8)

## 2020-10-29 NOTE — Assessment & Plan Note (Signed)
Current daily smoker, report 4-5 cigarettes per day since age 30.  Will continue to encourage cessation

## 2020-10-29 NOTE — Assessment & Plan Note (Signed)
Asymptomatic today. Patient advised to notify this office of increased symptoms and need to restart controller therapy if this occurs.

## 2020-10-29 NOTE — Assessment & Plan Note (Signed)
Patient will need

## 2020-10-29 NOTE — Assessment & Plan Note (Signed)
Intermittent. Work related, no red flag symptoms.  Patient given exercises/stretches HO  Patient to continue OTC meds as needed for intermittent symptoms

## 2020-10-30 ENCOUNTER — Encounter (HOSPITAL_COMMUNITY): Payer: Self-pay | Admitting: Family Medicine

## 2020-10-30 ENCOUNTER — Encounter: Payer: Self-pay | Admitting: Family Medicine

## 2020-10-30 NOTE — Progress Notes (Signed)
Results from 10/28/20 visit within normal limits. Result letter sent to patient.

## 2020-11-02 DIAGNOSIS — Z6830 Body mass index (BMI) 30.0-30.9, adult: Secondary | ICD-10-CM | POA: Insufficient documentation

## 2020-11-02 DIAGNOSIS — L732 Hidradenitis suppurativa: Secondary | ICD-10-CM | POA: Insufficient documentation

## 2020-11-02 NOTE — Assessment & Plan Note (Signed)
No actively draining areas at this point. Will obtain CBC to look for any signs of inflammation as patient expresses concern for reoccurrences.

## 2020-11-02 NOTE — Assessment & Plan Note (Signed)
Patient reports significant weight gain in the past few years. Denies hx of MDD or thyroid issues.  Will check CMP

## 2020-11-06 DIAGNOSIS — F4323 Adjustment disorder with mixed anxiety and depressed mood: Secondary | ICD-10-CM | POA: Diagnosis not present

## 2020-11-09 ENCOUNTER — Ambulatory Visit: Payer: Medicaid Other | Admitting: Family Medicine

## 2020-11-09 ENCOUNTER — Other Ambulatory Visit: Payer: Self-pay

## 2020-11-09 ENCOUNTER — Encounter: Payer: Self-pay | Admitting: Family Medicine

## 2020-11-09 DIAGNOSIS — F411 Generalized anxiety disorder: Secondary | ICD-10-CM

## 2020-11-09 MED ORDER — ESCITALOPRAM OXALATE 10 MG PO TABS
10.0000 mg | ORAL_TABLET | Freq: Every day | ORAL | 0 refills | Status: DC
Start: 2020-11-09 — End: 2024-06-28

## 2020-11-09 NOTE — Patient Instructions (Addendum)
It was a pleasure to see you today!  Thank you for choosing [facility] for your primary care.  Jenna Combs was seen for mood check.   Our plans for today were:  We will start a medication to help with your mood and anxiety.    To keep you healthy, please keep in mind the following health maintenance items that you are due for:   1. Pap smear  2.  Flu shot  3. COVID shot    You should return to our clinic in 2 weeks for medication check up and pap smear.    Best Wishes,   Dr. Neita Garnet    Therapy and Counseling Resources Most providers on this list will take Medicaid.  [facility]:Psychiatry and Counseling [address] [place] [place], [place] [address] [phone]  [facility]  [address] [place], [place] [address]      [phone]  [facility] [address]  [place], [place] [address] [phone]  [facility] [address]  [place], [place] [address]       [phone]      Jovita Kussmaul [facility] [address], [place], [place] [phone]  [facility]:  [address] [place] [employer] [place] [phone]  [facility]:  [address] [place]iener [phone]  [facility] (under & uninsured) [address]   [place] [phone]    [email or web address]    [facility] [address] . [place]    [phone]  [facility] -[address]     Phone:  [phone]     [facility]-  [address]  [phone]   [facility] [address]      [place], [place] [phone]  [facility]: [address] [place], [place], [place]  [phone]   [facility] (Spanish therapist) [email or web address]  [address] [place]  [address]   [place] [address]    [phone]     [facility]   [address],  [place], [place]  [phone]   [facility]  [address] [place]  [phone]  [facility]  [address] [place], [place]        [phone]  Open Access/Walk In Clinic under & uninsured  [facility]  [address] [place], [place] [phone] Crisis [phone]  [facility] [place],  (Spanish)   [address], [place]: [phone]) 8:30 - 12; 1 - 2:30  [facility] [employer],  [address] [place], [place]    [phone]):8:30 - 12; 2 - 3PM  [facility] [employer],  [address],  [place]; [phone]):   Mon - Fri 8 AM - 5 PM  [facility] [address] [place]  MWF 12:30 to 3:00 or call to schedule an appointment  [phone]  Specific Provider options Psychology Today  [email or web address]/us 4. click on find a therapist  5. enter your zip code 6. left side and select or tailor a therapist for your specific need.   [facility] Provider Directory [email or web address]  (Medicaid)   Follow all drop down to find a provider  Social Support program [facility] [place] [phone] or PhotoSolver.pl [address],  [place], [place] Recovery support and educational   24- Hour Availability:  .  Marland Kitchen [facility]  . [address] [place], [place] [employer] [phone] Crisis [phone]  . [facility] [employer] [phone]  [facility] Crisis Service  [phone]   . [facility] [employer]  [phone] (after hours)  . Therapeutic Alternative/Mobile Crisis   [phone]  . [place] National Suicide Hotline  [phone] (TALK)  . Call 911 or go to emergency room  . [employer]  [phone]);  [place] and [employer]   . [facility]   [phone]); [place], [place], [place], [place], [place], [place], [place]
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
## 2020-11-11 DIAGNOSIS — F411 Generalized anxiety disorder: Secondary | ICD-10-CM | POA: Insufficient documentation

## 2020-11-11 NOTE — Assessment & Plan Note (Addendum)
Elevated gad score and report of symptoms consistent with generalized anxiety.  -Prescribed escitalopram -Follow-up in 2 weeks for mood check -Also emphasized importance of sleep hygiene in 1 over tips to help with sleep -Given handout of therapies who is insurance for patient to establish for long-term therapy sessions

## 2020-11-13 DIAGNOSIS — F4323 Adjustment disorder with mixed anxiety and depressed mood: Secondary | ICD-10-CM | POA: Diagnosis not present

## 2020-11-20 DIAGNOSIS — F4323 Adjustment disorder with mixed anxiety and depressed mood: Secondary | ICD-10-CM | POA: Diagnosis not present

## 2020-11-27 DIAGNOSIS — F4323 Adjustment disorder with mixed anxiety and depressed mood: Secondary | ICD-10-CM | POA: Diagnosis not present

## 2020-11-30 NOTE — Patient Instructions (Incomplete)
It was a pleasure to see you today!  Thank you for choosing [facility] for your primary care.  Jenna Combs was seen for pap smear and vaginal discharge and mood check.   Our plans for today were:  We collected swabs to look for any STI as well as your pap smear. I will follow up with results once they are available.   Please continue to take your Escitalopram as prescribed.  I have provided a letter for Ethan's appointments on 1/12 and 1/24 of this year.    You should return to our clinic in 4 weeks for mood check.  Best Wishes,   Dr. Neita Garnet

## 2020-11-30 NOTE — Progress Notes (Signed)
    SUBJECTIVE:   CHIEF COMPLAINT / HPI: medication management and pap smear   GAD  Patient states that she recently started taking Escitalopram yesterday.  Gad score today is 10 which is improved from 15 at her prior visit.  She reports that she has not been experiencing any sweating or upset gastrointestinal system no dizziness or headaches.  Patient states that after a few dosages, she feels more mellow.  HM  Patient declines influenza and COVID 19 vaccines today. She is present for pap smear.   Vaginal Odor Patient reports that she has been experiencing vaginal odor for a few weeks. She is requesting any HIV and syphilis testing in addition to gonorrhea and Chlamydia testing.  Patient reports no abnormal bleeding or vaginal discharge.  Last menstrual cycle ended on 11/27/20 She is sexually active and using no contraception. She is considering starting depo provera injections for contraception but does not wish to start at this time.  She reports being with monogamous partner for 11 years.   PERTINENT  PMH / PSH:  GAD   OBJECTIVE:   BP 120/78   Pulse 83   Ht 5\' 4"  (1.626 m)   Wt 238 lb (108 kg)   LMP 11/27/2020 (Exact Date)   SpO2 98%   BMI 40.85 kg/m   General: female appearing stated age in no acute distress Cardio: Normal S1 and S2, no S3 or S4. Rhythm is regular Pulm: Clear to auscultation bilaterally, no crackles, wheezing, or diminished breath sounds. Abdomen: Bowel sounds normal. Abdomen soft and non-tender.   Genitalia:  Normal introitus for age, no external lesions, no vaginal discharge, mucosa pink and moist, no vaginal or cervical lesions, no vaginal atrophy, no friaility or hemorrhage, normal uterus size and position, no adnexal masses or tenderness   ASSESSMENT/PLAN:   Vaginal odor Patient reports longstanding vaginal odor and states that she has not had any vaginal discharge or abnormal bleeding.  Menstrual cycles are regular with the last ending on  11/27/2020.  Patient is sexually active and does not use barrier contraception nor oral contraception.  Patient would like to be screened for sexually transmitted infections. HIV RPR Gonorrhea and Chlamydia Wet prep completed We will follow up with results once available  Healthcare maintenance Patient presents for Pap smear today. Hepatitis C screening completed today Patient declines Covid and influenza vaccines  Generalized anxiety disorder Gad 7 score improved from 15-10 today Patient recently started citalopram Continue citalopram as prescribed Follow-up in 4 weeks    01/25/2021, MD [facility]

## 2020-12-01 ENCOUNTER — Other Ambulatory Visit (HOSPITAL_COMMUNITY)
Admission: RE | Admit: 2020-12-01 | Discharge: 2020-12-01 | Disposition: A | Discharge status: Home / Self Care | Payer: Medicaid Other | Source: Ambulatory Visit | Attending: Family Medicine | Admitting: Family Medicine

## 2020-12-01 ENCOUNTER — Other Ambulatory Visit: Payer: Self-pay

## 2020-12-01 ENCOUNTER — Ambulatory Visit: Payer: Medicaid Other | Admitting: Family Medicine

## 2020-12-01 VITALS — BP 120/78 | HR 83 | Ht 64.0 in | Wt 238.0 lb

## 2020-12-01 DIAGNOSIS — Z124 Encounter for screening for malignant neoplasm of cervix: Secondary | ICD-10-CM | POA: Insufficient documentation

## 2020-12-01 DIAGNOSIS — Z0000 Encounter for general adult medical examination without abnormal findings: Secondary | ICD-10-CM

## 2020-12-01 DIAGNOSIS — Z114 Encounter for screening for human immunodeficiency virus [HIV]: Secondary | ICD-10-CM | POA: Diagnosis not present

## 2020-12-01 DIAGNOSIS — N898 Other specified noninflammatory disorders of vagina: Secondary | ICD-10-CM | POA: Insufficient documentation

## 2020-12-01 DIAGNOSIS — Z1159 Encounter for screening for other viral diseases: Secondary | ICD-10-CM | POA: Diagnosis not present

## 2020-12-01 DIAGNOSIS — F411 Generalized anxiety disorder: Secondary | ICD-10-CM | POA: Diagnosis not present

## 2020-12-01 LAB — POCT WET PREP (WET MOUNT)
Clue Cells Wet Prep Whiff POC: POSITIVE
Trichomonas Wet Prep HPF POC: ABSENT

## 2020-12-01 MED ORDER — METRONIDAZOLE 500 MG PO TABS
500.0000 mg | ORAL_TABLET | Freq: Three times a day (TID) | ORAL | 0 refills | Status: DC
Start: 2020-12-01 — End: 2020-12-24

## 2020-12-01 NOTE — Addendum Note (Signed)
Addended by: Manson Passey, Shloimy Michalski on: 12/01/2020 11:32 AM   Modules accepted: Orders

## 2020-12-01 NOTE — Assessment & Plan Note (Signed)
Gad 7 score improved from 15-10 today Patient recently started citalopram Continue citalopram as prescribed Follow-up in 4 weeks

## 2020-12-01 NOTE — Assessment & Plan Note (Signed)
Patient reports longstanding vaginal odor and states that she has not had any vaginal discharge or abnormal bleeding.  Menstrual cycles are regular with the last ending on 11/27/2020.  Patient is sexually active and does not use barrier contraception nor oral contraception.  Patient would like to be screened for sexually transmitted infections. HIV RPR Gonorrhea and Chlamydia Wet prep completed We will follow up with results once available

## 2020-12-01 NOTE — Assessment & Plan Note (Signed)
Patient presents for Pap smear today. Hepatitis C screening completed today Patient declines Covid and influenza vaccines

## 2020-12-02 ENCOUNTER — Encounter: Payer: Self-pay | Admitting: Family Medicine

## 2020-12-02 LAB — HEPATITIS C ANTIBODY: Hep C Virus Ab: <0.1 s/co ratio (ref 0.0–0.9)

## 2020-12-02 LAB — HIV ANTIBODY (ROUTINE TESTING W REFLEX): HIV Screen 4th Generation wRfx: NONREACTIVE

## 2020-12-02 LAB — RPR: RPR Ser Ql: NONREACTIVE

## 2020-12-04 DIAGNOSIS — F4323 Adjustment disorder with mixed anxiety and depressed mood: Secondary | ICD-10-CM | POA: Diagnosis not present

## 2020-12-11 DIAGNOSIS — F4323 Adjustment disorder with mixed anxiety and depressed mood: Secondary | ICD-10-CM | POA: Diagnosis not present

## 2020-12-11 LAB — CYTOLOGY - PAP
Chlamydia: NEGATIVE
Comment: NEGATIVE
Comment: NEGATIVE
Comment: NORMAL
Diagnosis: NEGATIVE
Diagnosis: REACTIVE
High risk HPV: POSITIVE — AB
Neisseria Gonorrhea: NEGATIVE

## 2020-12-18 DIAGNOSIS — F4323 Adjustment disorder with mixed anxiety and depressed mood: Secondary | ICD-10-CM | POA: Diagnosis not present

## 2020-12-24 ENCOUNTER — Ambulatory Visit (INDEPENDENT_AMBULATORY_CARE_PROVIDER_SITE_OTHER): Payer: Medicaid Other

## 2020-12-24 ENCOUNTER — Ambulatory Visit (HOSPITAL_COMMUNITY)
Admission: EM | Admit: 2020-12-24 | Discharge: 2020-12-24 | Disposition: A | Discharge status: Home / Self Care | Payer: Medicaid Other | Attending: Emergency Medicine | Admitting: Emergency Medicine

## 2020-12-24 ENCOUNTER — Other Ambulatory Visit: Payer: Self-pay

## 2020-12-24 ENCOUNTER — Encounter (HOSPITAL_COMMUNITY): Payer: Self-pay | Admitting: Emergency Medicine

## 2020-12-24 DIAGNOSIS — M79671 Pain in right foot: Secondary | ICD-10-CM

## 2020-12-24 DIAGNOSIS — F4323 Adjustment disorder with mixed anxiety and depressed mood: Secondary | ICD-10-CM | POA: Diagnosis not present

## 2020-12-24 DIAGNOSIS — S93511A Sprain of interphalangeal joint of right great toe, initial encounter: Secondary | ICD-10-CM | POA: Diagnosis not present

## 2020-12-24 DIAGNOSIS — R600 Localized edema: Secondary | ICD-10-CM | POA: Diagnosis not present

## 2020-12-24 MED ORDER — IBUPROFEN 600 MG PO TABS
600.0000 mg | ORAL_TABLET | Freq: Four times a day (QID) | ORAL | 0 refills | Status: DC | PRN
Start: 2020-12-24 — End: 2022-09-12

## 2020-12-24 NOTE — ED Provider Notes (Signed)
HPI  SUBJECTIVE:  Jenna Combs is a 30 y.o. female who presents with right foot pain located along her right big toe and in the webspace between her first and second toes.  She states that she misstepped 9 days ago and bent her toes.  She is not sure exactly what direction they were bent in.  She reports stiffness, bruising in the webbing and limitation of motion of the toes.  No erythema.  Some numbness.  No tingling.  No other foot pain.  She tried Tylenol, ice, rest with improvement in her symptoms.  Symptoms are worse with standing, moving her first and second toes and with walking.  She works 12-hour shifts.  Past medical history no for diabetes, neuropathy, peripheral arterial disease, peripheral vascular disease.  LMP: "Just ended".  Denies possibility of being pregnant.  [facility]MD: [facility]    Past Medical History:  Diagnosis Date  . Asthma   . Seizures (HCC)     Past Surgical History:  Procedure Laterality Date  . BRAIN TUMOR EXCISION     As a child    Family History  Problem Relation Age of Onset  . Diabetes Mother   . Hypertension Father     Social History   Tobacco Use  . Smoking status: Current Some Day Smoker    Packs/day: 0.25    Years: 0.00    Pack years: 0.00    Types: Cigarettes  . Smokeless tobacco: Never Used  Vaping Use  . Vaping Use: Never used  Substance Use Topics  . Alcohol use: No  . Drug use: No    No current facility-administered medications for this encounter.  Current Outpatient Medications:  .  ibuprofen (ADVIL) 600 MG tablet, Take 1 tablet (600 mg total) by mouth every 6 (six) hours as needed., Disp: 30 tablet, Rfl: 0 .  acetaminophen (TYLENOL) 500 MG tablet, Take 500-1,000 mg by mouth every 6 (six) hours as needed for mild pain or moderate pain., Disp: , Rfl:  .  albuterol (PROVENTIL) (2.5 MG/3ML) 0.083% nebulizer solution, Take 3 mLs (2.5 mg total) by nebulization every 6 (six) hours as needed for wheezing or  shortness of breath., Disp: 200 mL, Rfl: 5 .  albuterol (VENTOLIN HFA) 108 (90 Base) MCG/ACT inhaler, Inhale 1-2 puffs into the lungs every 6 (six) hours as needed for wheezing or shortness of breath., Disp: 18 g, Rfl: 5 .  ammonium lactate (LAC-HYDRIN) 12 % cream, Apply topically as needed for dry skin., Disp: 385 g, Rfl: 0 .  escitalopram (LEXAPRO) 10 MG tablet, Take 1 tablet (10 mg total) by mouth daily., Disp: 60 tablet, Rfl: 0 .  fluticasone (FLOVENT HFA) 110 MCG/ACT inhaler, Inhale 2 puffs into the lungs 2 (two) times daily., Disp: 1 Inhaler, Rfl: 5 .  montelukast (SINGULAIR) 10 MG tablet, Take 1 tablet (10 mg total) by mouth at bedtime for 30 days., Disp: 30 tablet, Rfl: 1  Allergies  Allergen Reactions  . Mold Extract [Trichophyton] Other (See Comments)    Sneezing, runny nose     ROS  As noted in HPI.   Physical Exam  BP 99/77 (BP Location: Left Arm) Comment (BP Location): large cuff  Pulse 62   Temp 97.9 F (36.6 C) (Oral)   Resp 20   LMP 11/27/2020 (Exact Date)   SpO2 99%   Constitutional: Well developed, well nourished, no acute distress Eyes:  EOMI, conjunctiva normal bilaterally HENT: Normocephalic, atraumatic,mucus membranes moist Respiratory: Normal inspiratory effort Cardiovascular: Normal  rate GI: nondistended skin: No rash, skin intact Musculoskeletal: Right foot: No deformities, bruising, erythema, swelling.  No tenderness in the webspace between the first and second toe.  Tenderness at the medial IP right first toe.  No pain with flexion/extension.  No tenderness along lateral part of the toe.  No midfoot tenderness.  No tarsal, metatarsal tenderness.  No other phalangeal tenderness.  Cap refill less than 2 seconds.  Patient able to move all toes.  Sensation grossly intact.  Ankle, Achilles, heel nontender and normal Neurologic: Alert & oriented x 3, no focal neuro deficits Psychiatric: Speech and behavior appropriate   ED Course   Medications - No data  to display  Orders Placed This Encounter  Procedures  . DG Foot Complete Right    Standing Status:   Standing    Number of Occurrences:   1    Order Specific Question:   Reason for Exam (SYMPTOM  OR DIAGNOSIS REQUIRED)    Answer:   1st and 2nd toe/foot pain r/o fx    No results found for this or any previous visit (from the past 24 hour(s)). DG Foot Complete Right  Result Date: 12/24/2020 CLINICAL DATA:  First and second toe/right foot pain. Tripped on steps 1 week ago. EXAM: RIGHT FOOT COMPLETE - 3+ VIEW COMPARISON:  None. FINDINGS: Evidence acute fracture. There is offset of the tarsal metatarsal articulation on the lateral view, unable to further localize. No Lisfranc widening. First and second toes are intact without fracture. There is mild soft tissue edema overlying the dorsum of the foot. IMPRESSION: 1. Offset of 1 of the tarsal metatarsal articulations on the lateral view, without additional correlate on AP or oblique views. This may represent midfoot injury, cannot further characterized by radiograph. Recommend further evaluation with CT. 2. Soft tissue edema overlying the dorsum of the foot. 3. No other fracture. Particularly, no fracture of the first or second toes. Electronically Signed   By: Narda Rutherford M.D.   On: 12/24/2020 17:41    ED Clinical Impression  1. Sprain of interphalangeal joint of right great toe, initial encounter      ED Assessment/Plan  Reviewed imaging independently.  No fracture, dislocation as read by me.  Offset of lateral tarsal metatarsal articulations on the lateral view which could represent midfoot injury.  Recommends further evaluation with CT.  See radiology report for details.  Went over imaging with patient.  Presentation consistent with a sprained first toe.  She has no tenderness over the midfoot, however, given the x-ray findings, we will have her follow-up with podiatry/[facility] or orthopedics.  will have her buddy tape this, gave  her a roll of Coban.  Ice, Tylenol/ibuprofen, work note for several days.  Follow-up with PMD as needed.  Called patient, discussed finding of offset of in the tarsometatarsal articulations and lateral view concerning for midfoot injury, recommended CT by radiology.  She does not have any tenderness over the midfoot, so I am not completely sure that this is a true injury, however recommended that patient follow-up with the [facility] or with an orthopedic surgeon of her choice for reimaging and further evaluation if necessary.  Feel that this can be done as an outpatient and not emergently.  She agrees with plan.   Discussed imaging, MDM, treatment plan with patient. . patient agrees with plan.   Meds ordered this encounter  Medications  . ibuprofen (ADVIL) 600 MG tablet    Sig: Take 1 tablet (600  mg total) by mouth every 6 (six) hours as needed.    Dispense:  30 tablet    Refill:  0    *This clinic note was created using Scientist, clinical (histocompatibility and immunogenetics). Therefore, there may be occasional mistakes despite careful proofreading.   ?    Domenick Gong, MD 12/24/20 1840

## 2020-12-24 NOTE — ED Triage Notes (Signed)
Patient tripped on steps last week.  Right great toe is painful.  Pain is between great toe and next toe.  No visible injury.  Patient can move all toes.  Right pedal pulse is 2 +

## 2020-12-24 NOTE — Discharge Instructions (Signed)
Your x-ray was read as normal by radiology.  Take 600 mg of ibuprofen with 1000 mg Tylenol together 3-4 times a day as needed for pain.  Buddy tape your toe.  Ice your toe after work.  Elevate as much as you can.  He should be completely healed within 3 to 4 weeks.

## 2020-12-25 DIAGNOSIS — F4323 Adjustment disorder with mixed anxiety and depressed mood: Secondary | ICD-10-CM | POA: Diagnosis not present

## 2020-12-27 ENCOUNTER — Other Ambulatory Visit: Payer: Self-pay

## 2020-12-27 ENCOUNTER — Emergency Department (HOSPITAL_COMMUNITY)
Admission: EM | Admit: 2020-12-27 | Discharge: 2020-12-28 | Disposition: A | Discharge status: Home / Self Care | Payer: Medicaid Other | Attending: Emergency Medicine | Admitting: Emergency Medicine

## 2020-12-27 ENCOUNTER — Encounter (HOSPITAL_COMMUNITY): Payer: Self-pay

## 2020-12-27 DIAGNOSIS — Z7951 Long term (current) use of inhaled steroids: Secondary | ICD-10-CM | POA: Insufficient documentation

## 2020-12-27 DIAGNOSIS — F17210 Nicotine dependence, cigarettes, uncomplicated: Secondary | ICD-10-CM | POA: Diagnosis not present

## 2020-12-27 DIAGNOSIS — M791 Myalgia, unspecified site: Secondary | ICD-10-CM | POA: Diagnosis not present

## 2020-12-27 DIAGNOSIS — J45909 Unspecified asthma, uncomplicated: Secondary | ICD-10-CM | POA: Diagnosis not present

## 2020-12-27 DIAGNOSIS — R5383 Other fatigue: Secondary | ICD-10-CM | POA: Insufficient documentation

## 2020-12-27 DIAGNOSIS — R519 Headache, unspecified: Secondary | ICD-10-CM | POA: Insufficient documentation

## 2020-12-27 DIAGNOSIS — J029 Acute pharyngitis, unspecified: Secondary | ICD-10-CM | POA: Insufficient documentation

## 2020-12-27 DIAGNOSIS — R52 Pain, unspecified: Secondary | ICD-10-CM

## 2020-12-27 DIAGNOSIS — Z20822 Contact with and (suspected) exposure to covid-19: Secondary | ICD-10-CM | POA: Insufficient documentation

## 2020-12-27 DIAGNOSIS — R070 Pain in throat: Secondary | ICD-10-CM | POA: Diagnosis present

## 2020-12-27 NOTE — ED Triage Notes (Signed)
Pt reports that she woke up this morning with a sore throat, generalized body pain, chills, headache.

## 2020-12-28 LAB — SARS CORONAVIRUS 2 (TAT 6-24 HRS): SARS Coronavirus 2: NEGATIVE

## 2020-12-28 LAB — GROUP A STREP BY PCR: Group A Strep by PCR: NOT DETECTED

## 2020-12-28 NOTE — ED Provider Notes (Signed)
[facility] EMERGENCY DEPARTMENT Provider Note   CSN: [id number] Arrival date & time: 12/27/20  2022     History Chief Complaint  Patient presents with  . Sore Throat  . Generalized Body Aches    Jenna Combs is a 30 y.o. female.  The history is provided by the patient and medical records.  Sore Throat    30 year old female with history of asthma, seizures, presenting to the ED with headache, sore throat, body aches, generalized fatigue.  States she woke up with symptoms this morning.  She has recently been around someone with similar, unsure if they have cold, Covid, or other.  States initially she thought it was just the weather but symptoms seem to worsen throughout the day.  She has been able to eat and drink.  She has not had any diarrhea or vomiting.  No fever.  No meds prior to arrival. Past Medical History:  Diagnosis Date  . Asthma   . Seizures [facility])     Patient Active Problem List   Diagnosis Date Noted  . Vaginal odor 12/01/2020  . Healthcare maintenance 12/01/2020  . Generalized anxiety disorder 11/11/2020  . Hidradenitis suppurativa 11/02/2020  . BMI 30.0-30.9,adult 11/02/2020  . Lumbar pain 10/29/2020  . Tobacco use 10/29/2020  . BMI 40.0-44.9, adult (HCC) 10/29/2020  . Intermittent asthma without complication 09/25/2018    Past Surgical History:  Procedure Laterality Date  . BRAIN TUMOR EXCISION     As a child     OB History   No obstetric history on file.     Family History  Problem Relation Age of Onset  . Diabetes Mother   . Hypertension Father     Social History   Tobacco Use  . Smoking status: Current Some Day Smoker    Packs/day: 0.25    Years: 0.00    Pack years: 0.00    Types: Cigarettes  . Smokeless tobacco: Never Used  Vaping Use  . Vaping Use: Never used  Substance Use Topics  . Alcohol use: No  . Drug use: No    Home Medications Prior to Admission medications   Medication Sig Start Date  End Date Taking? Authorizing Provider  acetaminophen (TYLENOL) 500 MG tablet Take 500-1,000 mg by mouth every 6 (six) hours as needed for mild pain or moderate pain.    [provider]  albuterol (PROVENTIL) (2.5 MG/3ML) 0.083% nebulizer solution Take 3 mLs (2.5 mg total) by nebulization every 6 (six) hours as needed for wheezing or shortness of breath. 06/26/19   Fulp, Cammie, MD  albuterol (VENTOLIN HFA) 108 (90 Base) MCG/ACT inhaler Inhale 1-2 puffs into the lungs every 6 (six) hours as needed for wheezing or shortness of breath. 06/26/19   Fulp, Cammie, MD  ammonium lactate (LAC-HYDRIN) 12 % cream Apply topically as needed for dry skin. 02/11/20   Eustace Moore, MD  escitalopram (LEXAPRO) 10 MG tablet Take 1 tablet (10 mg total) by mouth daily. 11/09/20   Simmons-Robinson, Makiera, MD  fluticasone (FLOVENT HFA) 110 MCG/ACT inhaler Inhale 2 puffs into the lungs 2 (two) times daily. 06/26/19   Fulp, Cammie, MD  ibuprofen (ADVIL) 600 MG tablet Take 1 tablet (600 mg total) by mouth every 6 (six) hours as needed. 12/24/20   Domenick Gong, MD  montelukast (SINGULAIR) 10 MG tablet Take 1 tablet (10 mg total) by mouth at bedtime for 30 days. 01/17/19 07/24/19  Merrilee Jansky, MD    Allergies    Mold  extract [trichophyton]  Review of Systems   Review of Systems  Constitutional: Positive for fatigue.  HENT: Positive for sore throat.   Musculoskeletal: Positive for myalgias.  All other systems reviewed and are negative.   Physical Exam Updated Vital Signs BP 128/78 (BP Location: Left Arm)   Pulse 74   Temp 99 F (37.2 C) (Oral)   Resp 16   Ht 5\' 5"  (1.651 m)   Wt 104.3 kg   LMP 12/23/2020   SpO2 100%   BMI 38.27 kg/m   Physical Exam Vitals and nursing note reviewed.  Constitutional:      General: She is not in acute distress.    Appearance: She is well-developed. She is not diaphoretic.  HENT:     Head: Normocephalic and atraumatic.     Right Ear: External ear normal.      Left Ear: External ear normal.     Mouth/Throat:     Comments: Tonsils overall normal in appearance bilaterally without exudate; erythema of posterior oropharynx witho PND present, uvula midline without evidence of peritonsillar abscess; handling secretions appropriately; no difficulty swallowing or speaking; normal phonation without stridor Eyes:     Conjunctiva/sclera: Conjunctivae normal.     Pupils: Pupils are equal, round, and reactive to light.  Neck:     Comments: No rigidity, no meningismus Cardiovascular:     Rate and Rhythm: Normal rate and regular rhythm.     Heart sounds: Normal heart sounds. No murmur heard.   Pulmonary:     Effort: Pulmonary effort is normal. No respiratory distress.     Breath sounds: Normal breath sounds. No wheezing or rhonchi.  Abdominal:     General: Bowel sounds are normal.     Palpations: Abdomen is soft.     Tenderness: There is no abdominal tenderness. There is no guarding.  Musculoskeletal:        General: Normal range of motion.     Cervical back: Full passive range of motion without pain, normal range of motion and neck supple. No rigidity.  Skin:    General: Skin is warm and dry.     Findings: No rash.  Neurological:     Mental Status: She is alert and oriented to person, place, and time.     Cranial Nerves: No cranial nerve deficit.     Sensory: No sensory deficit.     Motor: No tremor or seizure activity.     Comments: AAOx3, answering questions and following commands appropriately; equal strength UE and LE bilaterally; CN grossly intact; moves all extremities appropriately without ataxia; no focal neuro deficits or facial asymmetry appreciated  Psychiatric:        Behavior: Behavior normal.        Thought Content: Thought content normal.     ED Results / Procedures / Treatments   Labs (all labs ordered are listed, but only abnormal results are displayed) Labs Reviewed  GROUP A STREP BY PCR  SARS CORONAVIRUS 2 (TAT 6-24 HRS)     EKG None  Radiology No results found.  Procedures Procedures   Medications Ordered in ED Medications - No data to display  ED Course  I have reviewed the triage vital signs and the nursing notes.  Pertinent labs & imaging results that were available during my care of the patient were reviewed by me and considered in my medical decision making (see chart for details).    MDM Rules/Calculators/A&P  30 y.o. F here with 1 day of sore throat,  body aches, headache, and fatigue.  Recent exposure with similar.  She is afebrile, non-toxic.  Does have some erythema of posterior oropharynx on exam with PND but no tonsillar edema or exudates.  Handling secretions well, without stridor.  No signs/symptoms suggestive of tonsillar abscess or deep space infection of the neck.  Rapid strep screen is negative.  covid test pending.  Feel she is stable for discharge home with symptomatic care.  Will be notified if covid test is positive.  She understands quarantine precautions if positive.  Work note given. Return here for new concerns.  Final Clinical Impression(s) / ED Diagnoses Final diagnoses:  Sore throat  Body aches    Rx / DC Orders ED Discharge Orders    None       Garlon Hatchet, PA-C 12/28/20 0110    Gilda Crease, MD 12/29/20 [phone]

## 2020-12-28 NOTE — Discharge Instructions (Signed)
You will be notified if covid test is positive.  If so you will need to quarantine.

## 2020-12-29 ENCOUNTER — Telehealth: Payer: Self-pay | Admitting: *Deleted

## 2020-12-29 NOTE — Telephone Encounter (Signed)
Transition Care Management Follow-up Telephone Call  Date of discharge and from where:3-14 Jenna Combs  How have you been since you were released from the hospital? Feeling better  Any questions or concerns? Yes    Advised of negative COVID results  Items Reviewed:  Did the pt receive and understand the discharge instructions provided? Yes   Medications obtained and verified? na  Other? na  Any new allergies since your discharge? No   Dietary orders reviewed? na  Do you have support at home? Yes    Functional Questionnaire: (I = Independent and D = Dependent) ADLs: I  Bathing/Dressing- I  Meal Prep- I  Eating- I  Maintaining continence- I  Transferring/Ambulation- I  Managing Meds- I  Follow up appointments reviewed:   PCP Hospital f/u appt confirmed? Yes  Scheduled to see primary care 3-18  Specialist Hospital f/u appt confirmed? No    Are transportation arrangements needed? No   If their condition worsens, is the pt aware to call PCP or go to the Emergency Dept.? yes  Was the patient provided with contact information for the PCP's office or ED? yes  Was to pt encouraged to call back with questions or concerns? yes

## 2020-12-31 NOTE — Patient Instructions (Signed)
It was a pleasure to see you today!  Thank you for choosing [facility] for your primary care.   Jenna Combs was seen for anxiety follow up.   Our plans for today were:  For your anxiety, please continue taking your medication on a daily basis.  I also recommend that you continue to speak with your therapist regularly.  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  Please continue to monitor for any new cysts developments as we will need to treat you accordingly.  Your recent hepatitis C HIV and syphilis tests were all negative.  Your Pap smear did not show any signs of malignancy.  Depending on what is decided at your gynecology visit on 3/24, you may need another Pap smear within the next year.   You should return to our clinic in 6 weeks for anxiety follow-up.   Best Wishes,   Dr. Neita Garnet

## 2020-12-31 NOTE — Progress Notes (Signed)
    SUBJECTIVE:   CHIEF COMPLAINT / HPI: anxiety f/u   Anxiety f/u  Patient recently started on citalopram. She reports mood is good but tired  Still does not feel she is getting enough sleep  Has been missing some days of her medication  Anxiety level feels about the same  She reports feeling more aware of her moood when she is not taking the medication  She notices taht external factors will irritate her, copes by removing self from situation  Still seeing therapist and feels that it is helpful   Review of Results  High Risk HPV results on recent pap, scheduled for GYN clinic.    Hidarenitis Suppurative  Reprots having this for years She has been counseled to not wear tight clothing but has not had any surgical procedures before  She has had to have them drained in the past  She reports that she now has them in groin area    PERTINENT  PMH / PSH:  GAD  OBJECTIVE:   BP 103/64   Pulse 82   Wt 235 lb 3.2 oz (106.7 kg)   LMP 12/23/2020   SpO2 100%   BMI 39.14 kg/m   General: female appearing stated age in no acute distress Cardio: Normal S1 and S2, no S3 or S4. Rhythm is regular. No murmurs or rubs.  Bilateral radial pulses palpable Pulm:  Normal respiratory effort, stable on RA Abdomen: Bowel sounds normal. Abdomen soft and non-tender.  Skin: hyperpigmentation in groin area and medial thighs, no actively draining lesions at this tim e  ASSESSMENT/PLAN:   Hidradenitis suppurativa Patient reports scarring in intertriginous areas. Denies active lesions at this time, no draining or pain.  Discussed hx of having lesions drained.  Encouraged patient to schedule appt if she begins to have tenderness or drainage, patient in agreement    Generalized anxiety disorder Poor adherence to medication regimen.  Patient continues to speak with therapist through family services, feels this is helpful for her  Continue lexapro 10mg , encouraged patient to take this regularly for most  effect       , MD [facility]

## 2021-01-01 ENCOUNTER — Encounter: Payer: Self-pay | Admitting: Family Medicine

## 2021-01-01 ENCOUNTER — Other Ambulatory Visit: Payer: Self-pay

## 2021-01-01 ENCOUNTER — Ambulatory Visit: Payer: Medicaid Other | Admitting: Family Medicine

## 2021-01-01 DIAGNOSIS — L732 Hidradenitis suppurativa: Secondary | ICD-10-CM | POA: Diagnosis not present

## 2021-01-01 DIAGNOSIS — F411 Generalized anxiety disorder: Secondary | ICD-10-CM

## 2021-01-03 ENCOUNTER — Encounter: Payer: Self-pay | Admitting: Family Medicine

## 2021-01-03 NOTE — Assessment & Plan Note (Signed)
Poor adherence to medication regimen.  Patient continues to speak with therapist through family services, feels this is helpful for her  Continue lexapro 10mg , encouraged patient to take this regularly for most effect

## 2021-01-03 NOTE — Assessment & Plan Note (Signed)
Patient reports scarring in intertriginous areas. Denies active lesions at this time, no draining or pain.  Discussed hx of having lesions drained.  Encouraged patient to schedule appt if she begins to have tenderness or drainage, patient in agreement

## 2021-01-07 ENCOUNTER — Ambulatory Visit: Payer: Medicaid Other

## 2021-01-08 DIAGNOSIS — F4323 Adjustment disorder with mixed anxiety and depressed mood: Secondary | ICD-10-CM | POA: Diagnosis not present

## 2021-01-21 ENCOUNTER — Ambulatory Visit: Payer: Medicaid Other

## 2021-01-21 DIAGNOSIS — F4323 Adjustment disorder with mixed anxiety and depressed mood: Secondary | ICD-10-CM | POA: Diagnosis not present

## 2021-01-28 DIAGNOSIS — F4323 Adjustment disorder with mixed anxiety and depressed mood: Secondary | ICD-10-CM | POA: Diagnosis not present

## 2021-02-04 ENCOUNTER — Other Ambulatory Visit (HOSPITAL_COMMUNITY)
Admission: RE | Admit: 2021-02-04 | Discharge: 2021-02-04 | Disposition: A | Discharge status: Home / Self Care | Payer: Medicaid Other | Source: Ambulatory Visit | Attending: Family Medicine | Admitting: Family Medicine

## 2021-02-04 ENCOUNTER — Other Ambulatory Visit: Payer: Self-pay | Admitting: Family Medicine

## 2021-02-04 ENCOUNTER — Ambulatory Visit (INDEPENDENT_AMBULATORY_CARE_PROVIDER_SITE_OTHER): Payer: Medicaid Other | Admitting: Family Medicine

## 2021-02-04 ENCOUNTER — Other Ambulatory Visit: Payer: Self-pay

## 2021-02-04 VITALS — BP 112/76 | HR 90 | Ht 65.0 in | Wt 231.2 lb

## 2021-02-04 DIAGNOSIS — B977 Papillomavirus as the cause of diseases classified elsewhere: Secondary | ICD-10-CM | POA: Insufficient documentation

## 2021-02-04 DIAGNOSIS — N72 Inflammatory disease of cervix uteri: Secondary | ICD-10-CM

## 2021-02-04 DIAGNOSIS — Z0000 Encounter for general adult medical examination without abnormal findings: Secondary | ICD-10-CM | POA: Diagnosis not present

## 2021-02-04 LAB — POCT URINE PREGNANCY: Preg Test, Ur: NEGATIVE

## 2021-02-04 NOTE — Progress Notes (Signed)
Patient ID: Jenna Combs, female   DOB: [birth year], 30 y.o.   MRN: [id number]  Chief Complaint  Patient presents with  . Follow-up    Abnormal pap smear.     HPI Jenna Combs is a 30 y.o. female.  Here for colposcopy HPI  Indications: Pap smear on February 2022 showed: +High tisk HPV. Previous colposcopy: N/A. Prior cervical treatment: no treatment.  Past Medical History:  Diagnosis Date  . Asthma   . Seizures (HCC)     Past Surgical History:  Procedure Laterality Date  . BRAIN TUMOR EXCISION     As a child    Family History  Problem Relation Age of Onset  . Diabetes Mother   . Hypertension Father     Social History Social History   Tobacco Use  . Smoking status: Current Some Day Smoker    Packs/day: 0.25    Years: 0.00    Pack years: 0.00    Types: Cigarettes  . Smokeless tobacco: Never Used  Vaping Use  . Vaping Use: Never used  Substance Use Topics  . Alcohol use: No  . Drug use: No    Allergies  Allergen Reactions  . Mold Extract [Trichophyton] Other (See Comments)    Sneezing, runny nose    Current Outpatient Medications  Medication Sig Dispense Refill  . acetaminophen (TYLENOL) 500 MG tablet Take 500-1,000 mg by mouth every 6 (six) hours as needed for mild pain or moderate pain.    Marland Kitchen albuterol (PROVENTIL) (2.5 MG/3ML) 0.083% nebulizer solution Take 3 mLs (2.5 mg total) by nebulization every 6 (six) hours as needed for wheezing or shortness of breath. 200 mL 5  . albuterol (VENTOLIN HFA) 108 (90 Base) MCG/ACT inhaler Inhale 1-2 puffs into the lungs every 6 (six) hours as needed for wheezing or shortness of breath. 18 g 5  . ammonium lactate (LAC-HYDRIN) 12 % cream Apply topically as needed for dry skin. 385 g 0  . escitalopram (LEXAPRO) 10 MG tablet Take 1 tablet (10 mg total) by mouth daily. 60 tablet 0  . fluticasone (FLOVENT HFA) 110 MCG/ACT inhaler Inhale 2 puffs into the lungs 2 (two) times daily. 1 Inhaler 5  . ibuprofen (ADVIL)  600 MG tablet Take 1 tablet (600 mg total) by mouth every 6 (six) hours as needed. 30 tablet 0  . montelukast (SINGULAIR) 10 MG tablet Take 1 tablet (10 mg total) by mouth at bedtime for 30 days. 30 tablet 1   No current facility-administered medications for this visit.    Review of Systems Review of Systems  Blood pressure 112/76, pulse 90, height 5\' 5"  (1.651 m), weight 231 lb 3.2 oz (104.9 kg), last menstrual period 01/12/2021, SpO2 97 %.  Physical Exam Physical Exam  Data Reviewed 02/04/21  Assessment    Procedure Details  The risks and benefits of the procedure and Written informed consent obtained.  Speculum placed in vagina and excellent visualization of cervix achieved, cervix swabbed x 3 with acetic acid solution. Lugols iodine applied.  + acetowhite and no lugols uptake at 11, 12(smaller lesion) and 1 O'clock, all lesion appears similar   Specimens: ECC and Cervical biopsy at 12 and 1 O'clock,and a part of 11 O'clock as it was  difficult to biopsy 11 O'clock (slippery). All lesions are similar and congruent with the other lesions. See image in Media.    Complications: none.     Plan    Specimens labelled and sent to Pathology. Return to discuss Pathology  results in 2 weeks.      Jenna Combs 02/04/2021, 10:15 AM

## 2021-02-04 NOTE — Patient Instructions (Signed)

## 2021-02-05 DIAGNOSIS — N72 Inflammatory disease of cervix uteri: Secondary | ICD-10-CM | POA: Diagnosis not present

## 2021-02-05 DIAGNOSIS — F4323 Adjustment disorder with mixed anxiety and depressed mood: Secondary | ICD-10-CM | POA: Diagnosis not present

## 2021-02-05 DIAGNOSIS — B977 Papillomavirus as the cause of diseases classified elsewhere: Secondary | ICD-10-CM | POA: Diagnosis not present

## 2021-02-05 NOTE — Addendum Note (Signed)
Addended by: Jennette Bill on: 02/05/2021 09:58 AM   Modules accepted: Orders

## 2021-02-05 NOTE — Addendum Note (Signed)
Addended by: Janit Pagan T on: 02/05/2021 11:16 AM   Modules accepted: Orders

## 2021-02-08 LAB — SURGICAL PATHOLOGY

## 2021-02-15 ENCOUNTER — Telehealth: Payer: Self-pay | Admitting: Family Medicine

## 2021-02-15 NOTE — Telephone Encounter (Signed)
Result discussed. Normal ECC  + Cervical biopsy: LSIL.   Recommendation: Cotesting in 1 yr.  All questions were answered. I updated her HM to reflect repeat PAP in 1 yr.  Message routed to her PCP

## 2021-02-18 DIAGNOSIS — F4323 Adjustment disorder with mixed anxiety and depressed mood: Secondary | ICD-10-CM | POA: Diagnosis not present

## 2021-02-18 NOTE — Telephone Encounter (Signed)
I called her back to inform about the chronic cervicitis. 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  She had recent neg STD screen except trich.  ?? HPV causing chronic cervicitis.  I advised her to see PCP soon for STD screen if having any vaginal discharge.  She agreed with the plan.

## 2021-03-11 ENCOUNTER — Ambulatory Visit (HOSPITAL_COMMUNITY)
Admission: EM | Admit: 2021-03-11 | Discharge: 2021-03-11 | Disposition: A | Discharge status: Home / Self Care | Payer: Medicaid Other | Attending: Emergency Medicine | Admitting: Emergency Medicine

## 2021-03-11 ENCOUNTER — Encounter (HOSPITAL_COMMUNITY): Payer: Self-pay

## 2021-03-11 ENCOUNTER — Other Ambulatory Visit: Payer: Self-pay

## 2021-03-11 DIAGNOSIS — N912 Amenorrhea, unspecified: Secondary | ICD-10-CM | POA: Insufficient documentation

## 2021-03-11 LAB — POCT URINALYSIS DIPSTICK, ED / UC
Glucose, UA: NEGATIVE mg/dL
Hgb urine dipstick: NEGATIVE
Ketones, ur: NEGATIVE mg/dL
Leukocytes,Ua: NEGATIVE
Nitrite: NEGATIVE
Protein, ur: NEGATIVE mg/dL
Specific Gravity, Urine: >=1.03 (ref 1.005–1.030)
Urobilinogen, UA: 1 mg/dL (ref 0.0–1.0)
pH: 6 (ref 5.0–8.0)

## 2021-03-11 LAB — HCG, QUANTITATIVE, PREGNANCY: hCG, Beta Chain, Quant, S: 1 m[IU]/mL (ref <5)

## 2021-03-11 LAB — POC URINE PREG, ED: Preg Test, Ur: NEGATIVE

## 2021-03-11 NOTE — ED Triage Notes (Signed)
Pt is here today to have a pregnancy test done. She states is late on her menstrual cycle.

## 2021-03-11 NOTE — Discharge Instructions (Addendum)
Urine pregnancy was negative  Urinalysis was negative  HCG for pregnancy pending, you will be called if positive  Sti screening pending 2-3 days,  you will be notified if positive for infection for treatment, do not have sex until labs result and/or treatment complete   Schedule an appointment with gynecology as soon as possible if all testing today is negative for further evaluation, 2 practice's information listed below

## 2021-03-11 NOTE — ED Provider Notes (Signed)
[facility]    CSN: [id number] Arrival date & time: 03/11/21  5329      History   Chief Complaint Chief Complaint  Patient presents with  . Possible Pregnancy    HPI Jenna Combs is a 30 y.o. female.   Patient presents requesting pregnancy test after menses being late for a least a month, possibly longer, unable to recall exact month of last menses. Sexually active. Denies discharge, itching, odor, frequency, urgency, abdominal pain, flank pain, rash, lesions, fever, chills. Recent positive pap smear for HPV, colposcopy completed, negative with repeat in 1 year. Chronic cervicitis mentioned in note.    Past Medical History:  Diagnosis Date  . Asthma   . Seizures [facility])     Patient Active Problem List   Diagnosis Date Noted  . Vaginal odor 12/01/2020  . Healthcare maintenance 12/01/2020  . Generalized anxiety disorder 11/11/2020  . Hidradenitis suppurativa 11/02/2020  . BMI 30.0-30.9,adult 11/02/2020  . Lumbar pain 10/29/2020  . Tobacco use 10/29/2020  . BMI 40.0-44.9, adult (HCC) 10/29/2020  . Intermittent asthma without complication 09/25/2018    Past Surgical History:  Procedure Laterality Date  . BRAIN TUMOR EXCISION     As a child    OB History   No obstetric history on file.      Home Medications    Prior to Admission medications   Medication Sig Start Date End Date Taking? Authorizing Provider  acetaminophen (TYLENOL) 500 MG tablet Take 500-1,000 mg by mouth every 6 (six) hours as needed for mild pain or moderate pain.    [provider]  albuterol (PROVENTIL) (2.5 MG/3ML) 0.083% nebulizer solution Take 3 mLs (2.5 mg total) by nebulization every 6 (six) hours as needed for wheezing or shortness of breath. 06/26/19   Fulp, Cammie, MD  albuterol (VENTOLIN HFA) 108 (90 Base) MCG/ACT inhaler Inhale 1-2 puffs into the lungs every 6 (six) hours as needed for wheezing or shortness of breath. 06/26/19   Fulp, Cammie, MD  ammonium lactate  (LAC-HYDRIN) 12 % cream Apply topically as needed for dry skin. 02/11/20   Eustace Moore, MD  escitalopram (LEXAPRO) 10 MG tablet Take 1 tablet (10 mg total) by mouth daily. 11/09/20   Simmons-Robinson, Makiera, MD  fluticasone (FLOVENT HFA) 110 MCG/ACT inhaler Inhale 2 puffs into the lungs 2 (two) times daily. 06/26/19   Fulp, Cammie, MD  ibuprofen (ADVIL) 600 MG tablet Take 1 tablet (600 mg total) by mouth every 6 (six) hours as needed. 12/24/20   Domenick Gong, MD  montelukast (SINGULAIR) 10 MG tablet Take 1 tablet (10 mg total) by mouth at bedtime for 30 days. 01/17/19 07/24/19  Merrilee Jansky, MD    Family History Family History  Problem Relation Age of Onset  . Diabetes Mother   . Hypertension Father     Social History Social History   Tobacco Use  . Smoking status: Current Some Day Smoker    Packs/day: 0.25    Years: 0.00    Pack years: 0.00    Types: Cigarettes  . Smokeless tobacco: Never Used  Vaping Use  . Vaping Use: Never used  Substance Use Topics  . Alcohol use: No  . Drug use: No     Allergies   Mold extract [trichophyton]   Review of Systems Review of Systems  Defer to HPI    Physical Exam Triage Vital Signs ED Triage Vitals  Enc Vitals Group     BP 03/11/21 1056 123/75  Pulse Rate 03/11/21 1056 74     Resp 03/11/21 1056 20     Temp 03/11/21 1056 98.8 F (37.1 C)     Temp Source 03/11/21 1056 Oral     SpO2 03/11/21 1056 100 %     Weight --      Height --      Head Circumference --      Peak Flow --      Pain Score 03/11/21 1055 0     Pain Loc --      Pain Edu? --      Excl. in GC? --    No data found.  Updated Vital Signs BP 123/75 (BP Location: Left Arm)   Pulse 74   Temp 98.8 F (37.1 C) (Oral)   Resp 20   LMP  (LMP Unknown)   SpO2 100%   Visual Acuity Right Eye Distance:   Left Eye Distance:   Bilateral Distance:    Right Eye Near:   Left Eye Near:    Bilateral Near:     Physical Exam Constitutional:       Appearance: Normal appearance. She is obese.  HENT:     Head: Normocephalic.  Eyes:     Extraocular Movements: Extraocular movements intact.  Pulmonary:     Effort: Pulmonary effort is normal.  Genitourinary:    Comments: Deferred, self collect vaginal swab Musculoskeletal:        General: Normal range of motion.  Skin:    General: Skin is warm and dry.  Neurological:     General: No focal deficit present.     Mental Status: She is alert and oriented to person, place, and time. Mental status is at baseline.  Psychiatric:        Mood and Affect: Mood normal.        Behavior: Behavior normal.        Thought Content: Thought content normal.        Judgment: Judgment normal.      UC Treatments / Results  Labs (all labs ordered are listed, but only abnormal results are displayed) Labs Reviewed  POCT URINALYSIS DIPSTICK, ED / UC - Abnormal; Notable for the following components:      Result Value   Bilirubin Urine SMALL (*)    All other components within normal limits  HCG, QUANTITATIVE, PREGNANCY  POC URINE PREG, ED  POC URINE PREG, ED  CERVICOVAGINAL ANCILLARY ONLY    EKG   Radiology No results found.  Procedures Procedures (including critical care time)  Medications Ordered in UC Medications - No data to display  Initial Impression / Assessment and Plan / UC Course  I have reviewed the triage vital signs and the nursing notes.  Pertinent labs & imaging results that were available during my care of the patient were reviewed by me and considered in my medical decision making (see chart for details).  Amenorrhea  1. Urine pregnancy- negative 2. HCG- pending 3. urinalysis negative 4. sti screening pending, will treat per protocol, advised abstinence while labs result 5. Referral to gynecology given, recommended follow up as soon as possible if all testing negative today   Final Clinical Impressions(s) / UC Diagnoses   Final diagnoses:  Amenorrhea      Discharge Instructions     Urine pregnancy was negative  Urinalysis was negative  HCG for pregnancy pending, you will be called if positive  Sti screening pending 2-3 days,  you will be notified if positive for  infection for treatment, do not have sex until labs result and/or treatment complete   Schedule an appointment with gynecology as soon as possible if all testing today is negative for further evaluation, 2 practice's information listed below     ED Prescriptions    None     PDMP not reviewed this encounter.   Valinda Hoar, NP 03/11/21 1149

## 2021-03-12 ENCOUNTER — Telehealth (HOSPITAL_COMMUNITY): Payer: Self-pay | Admitting: Emergency Medicine

## 2021-03-12 DIAGNOSIS — F4323 Adjustment disorder with mixed anxiety and depressed mood: Secondary | ICD-10-CM | POA: Diagnosis not present

## 2021-03-12 LAB — CERVICOVAGINAL ANCILLARY ONLY
Bacterial Vaginitis (gardnerella): NEGATIVE
Candida Glabrata: NEGATIVE
Candida Vaginitis: POSITIVE — AB
Chlamydia: NEGATIVE
Comment: NEGATIVE
Comment: NEGATIVE
Comment: NEGATIVE
Comment: NEGATIVE
Comment: NEGATIVE
Comment: NORMAL
Neisseria Gonorrhea: NEGATIVE
Trichomonas: NEGATIVE

## 2021-03-12 MED ORDER — FLUCONAZOLE 150 MG PO TABS
150.0000 mg | ORAL_TABLET | Freq: Once | ORAL | 0 refills | Status: AC
Start: 2021-03-12 — End: 2021-03-12

## 2021-03-18 DIAGNOSIS — F4323 Adjustment disorder with mixed anxiety and depressed mood: Secondary | ICD-10-CM | POA: Diagnosis not present

## 2021-03-26 DIAGNOSIS — F4323 Adjustment disorder with mixed anxiety and depressed mood: Secondary | ICD-10-CM | POA: Diagnosis not present

## 2021-05-11 DIAGNOSIS — F4323 Adjustment disorder with mixed anxiety and depressed mood: Secondary | ICD-10-CM | POA: Diagnosis not present

## 2021-05-18 DIAGNOSIS — F4323 Adjustment disorder with mixed anxiety and depressed mood: Secondary | ICD-10-CM | POA: Diagnosis not present

## 2021-05-27 DIAGNOSIS — F4323 Adjustment disorder with mixed anxiety and depressed mood: Secondary | ICD-10-CM | POA: Diagnosis not present

## 2021-06-01 DIAGNOSIS — F4323 Adjustment disorder with mixed anxiety and depressed mood: Secondary | ICD-10-CM | POA: Diagnosis not present

## 2021-06-24 DIAGNOSIS — F4323 Adjustment disorder with mixed anxiety and depressed mood: Secondary | ICD-10-CM | POA: Diagnosis not present

## 2021-12-22 ENCOUNTER — Ambulatory Visit: Payer: Medicaid Other | Admitting: Physician Assistant

## 2021-12-28 DIAGNOSIS — Z0389 Encounter for observation for other suspected diseases and conditions ruled out: Secondary | ICD-10-CM | POA: Diagnosis not present
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
## 2022-01-17 ENCOUNTER — Ambulatory Visit: Payer: Medicaid Other | Admitting: Family Medicine

## 2022-05-25 ENCOUNTER — Encounter (HOSPITAL_COMMUNITY): Payer: Self-pay

## 2022-05-25 ENCOUNTER — Ambulatory Visit (HOSPITAL_COMMUNITY)
Admission: EM | Admit: 2022-05-25 | Discharge: 2022-05-25 | Disposition: A | Discharge status: Home / Self Care | Payer: Medicaid Other | Attending: Emergency Medicine | Admitting: Emergency Medicine

## 2022-05-25 DIAGNOSIS — M25572 Pain in left ankle and joints of left foot: Secondary | ICD-10-CM | POA: Diagnosis not present

## 2022-05-25 MED ORDER — KETOROLAC TROMETHAMINE 30 MG/ML IJ SOLN
30.0000 mg | Freq: Once | INTRAMUSCULAR | Status: AC
  Administered 2022-05-25: 30 mg via INTRAMUSCULAR

## 2022-05-25 MED ORDER — PREDNISONE 20 MG PO TABS: 40.0000 mg | ORAL_TABLET | Freq: Every day | ORAL | 0 refills | Status: DC

## 2022-05-25 MED ORDER — KETOROLAC TROMETHAMINE 30 MG/ML IJ SOLN
INTRAMUSCULAR | Status: AC
  Filled 2022-05-25: qty 1

## 2022-05-25 NOTE — ED Triage Notes (Signed)
Pt c/o lt ankle swelling and pain x6 days. Denies injury but states stands all day at work. Denies taking any meds, has elevated ankle.

## 2022-05-25 NOTE — ED Provider Notes (Addendum)
[facility]    CSN: [id number] Arrival date & time: 05/25/22  1859      History   Chief Complaint Chief Complaint  Patient presents with   Ankle Pain    HPI Jenna Combs is a 31 y.o. female.   Patient presents with left ankle pain and swelling for 6 days.  Endorses present to the lateral and posterior aspect ".  Denies injury, trauma or precipitating event but endorses that she does stand for long hours while at work.  Has been painful to bear weight and pain is elicited with flexion of the foot.  Has attempted use of compression socks and over-the-counter diuretic pills which have been ineffective.  Denies numbness, tingling.   Past Medical History:  Diagnosis Date   Asthma    Seizures [facility])     Patient Active Problem List   Diagnosis Date Noted   Vaginal odor 12/01/2020   Healthcare maintenance 12/01/2020   Generalized anxiety disorder 11/11/2020   Hidradenitis suppurativa 11/02/2020   BMI 30.0-30.9,adult 11/02/2020   Lumbar pain 10/29/2020   Tobacco use 10/29/2020   BMI 40.0-44.9, adult (HCC) 10/29/2020   Intermittent asthma without complication 09/25/2018    Past Surgical History:  Procedure Laterality Date   BRAIN TUMOR EXCISION     As a child    OB History   No obstetric history on file.      Home Medications    Prior to Admission medications   Medication Sig Start Date End Date Taking? Authorizing Provider  acetaminophen (TYLENOL) 500 MG tablet Take 500-1,000 mg by mouth every 6 (six) hours as needed for mild pain or moderate pain.    [provider]  albuterol (PROVENTIL) (2.5 MG/3ML) 0.083% nebulizer solution Take 3 mLs (2.5 mg total) by nebulization every 6 (six) hours as needed for wheezing or shortness of breath. 06/26/19   Fulp, Cammie, MD  albuterol (VENTOLIN HFA) 108 (90 Base) MCG/ACT inhaler Inhale 1-2 puffs into the lungs every 6 (six) hours as needed for wheezing or shortness of breath. 06/26/19   Fulp, Cammie, MD   ammonium lactate (LAC-HYDRIN) 12 % cream Apply topically as needed for dry skin. 02/11/20   Eustace Moore, MD  escitalopram (LEXAPRO) 10 MG tablet Take 1 tablet (10 mg total) by mouth daily. 11/09/20   Simmons-Robinson, Makiera, MD  fluticasone (FLOVENT HFA) 110 MCG/ACT inhaler Inhale 2 puffs into the lungs 2 (two) times daily. 06/26/19   Fulp, Cammie, MD  ibuprofen (ADVIL) 600 MG tablet Take 1 tablet (600 mg total) by mouth every 6 (six) hours as needed. 12/24/20   Domenick Gong, MD  montelukast (SINGULAIR) 10 MG tablet Take 1 tablet (10 mg total) by mouth at bedtime for 30 days. 01/17/19 07/24/19  Merrilee Jansky, MD    Family History Family History  Problem Relation Age of Onset   Diabetes Mother    Hypertension Father     Social History Social History   Tobacco Use   Smoking status: Some Days    Packs/day: 0.25    Years: 0.00    Total pack years: 0.00    Types: Cigarettes   Smokeless tobacco: Never  Vaping Use   Vaping Use: Never used  Substance Use Topics   Alcohol use: No   Drug use: No     Allergies   Mold extract [trichophyton]   Review of Systems Review of Systems  Constitutional: Negative.   Respiratory: Negative.    Cardiovascular: Negative.   Musculoskeletal:  Positive for myalgias. Negative for arthralgias, back pain, gait problem, joint swelling, neck pain and neck stiffness.  Skin: Negative.   Neurological: Negative.      Physical Exam Triage Vital Signs ED Triage Vitals  Enc Vitals Group     BP 05/25/22 1928 112/80     Pulse Rate 05/25/22 1928 68     Resp 05/25/22 1928 18     Temp 05/25/22 1928 98.2 F (36.8 C)     Temp Source 05/25/22 1928 Oral     SpO2 05/25/22 1928 99 %     Weight --      Height --      Head Circumference --      Peak Flow --      Pain Score 05/25/22 1929 10     Pain Loc --      Pain Edu? --      Excl. in GC? --    No data found.  Updated Vital Signs BP 112/80 (BP Location: Left Arm)   Pulse 68   Temp  98.2 F (36.8 C) (Oral)   Resp 18   LMP 05/10/2022   SpO2 99%   Visual Acuity Right Eye Distance:   Left Eye Distance:   Bilateral Distance:    Right Eye Near:   Left Eye Near:    Bilateral Near:     Physical Exam Constitutional:      Appearance: Normal appearance.  HENT:     Head: Normocephalic.  Pulmonary:     Effort: Pulmonary effort is normal.  Musculoskeletal:     Comments: Mild to moderate swelling noted to the lateral aspect of the left ankle, no tenderness present, point tenderness present along the Achilles tendon without swelling, able to bear weight, range of motion intact, pain is elicited with flexion, sensation is intact, dorsalis pedis pulse 2+  Skin:    General: Skin is warm and dry.  Neurological:     Mental Status: She is alert and oriented to person, place, and time. Mental status is at baseline.  Psychiatric:        Mood and Affect: Mood normal.        Behavior: Behavior normal.      UC Treatments / Results  Labs (all labs ordered are listed, but only abnormal results are displayed) Labs Reviewed - No data to display  EKG   Radiology No results found.  Procedures Procedures (including critical care time)  Medications Ordered in UC Medications - No data to display  Initial Impression / Assessment and Plan / UC Course  I have reviewed the triage vital signs and the nursing notes.  Pertinent labs & imaging results that were available during my care of the patient were reviewed by me and considered in my medical decision making (see chart for details).  Acute left ankle pain  Presentation is consistent with irritation to Achilles tendon, discussed with patient, as there is no injury will defer imaging, Toradol injection in office and prednisone course prescribed for outpatient recommended continued use of RICE with follow-up with podiatry if symptoms persist or worsen Final Clinical Impressions(s) / UC Diagnoses   Final diagnoses:  None    Discharge Instructions   None    ED Prescriptions   None    PDMP not reviewed this encounter.   Valinda Hoar, NP 05/25/22 1951    Valinda Hoar, NP 05/25/22 [phone]

## 2022-05-25 NOTE — Discharge Instructions (Signed)
Today I believe your pain is related to irritation to your Achilles tendon   You have been given an injection of Toradol in office today to help reduce inflammation that occurs with injury which in turn will help your pain  Starting tomorrow take prednisone every morning with food for 5 days to continue the process above, you may take Tylenol 500 to 1000 mg every 6 hours as needed for additional comfort while on the use of steroids  You may use ice or heat over the affected area 10 to 15-minute intervals  You may continue use of compression to help with support and stability  You may elevate foot and ankle when sitting and lying to help reduce swelling  If your symptoms continue to persist or worsen you may follow-up with podiatry, information is listed on the front of your paperwork

## 2022-09-12 ENCOUNTER — Encounter (HOSPITAL_COMMUNITY): Payer: Self-pay

## 2022-09-12 ENCOUNTER — Ambulatory Visit (HOSPITAL_COMMUNITY)
Admission: EM | Admit: 2022-09-12 | Discharge: 2022-09-12 | Disposition: A | Discharge status: Home / Self Care | Payer: Medicaid Other | Attending: Family Medicine | Admitting: Family Medicine

## 2022-09-12 ENCOUNTER — Ambulatory Visit (INDEPENDENT_AMBULATORY_CARE_PROVIDER_SITE_OTHER): Payer: Medicaid Other

## 2022-09-12 DIAGNOSIS — M79672 Pain in left foot: Secondary | ICD-10-CM

## 2022-09-12 MED ORDER — IBUPROFEN 800 MG PO TABS: 800.0000 mg | ORAL_TABLET | Freq: Three times a day (TID) | ORAL | 0 refills | Status: DC | PRN

## 2022-09-12 MED ORDER — KETOROLAC TROMETHAMINE 30 MG/ML IJ SOLN: 30.0000 mg | Freq: Once | INTRAMUSCULAR | Status: DC

## 2022-09-12 NOTE — ED Provider Notes (Signed)
[name]    CSN: [id number] Arrival date & time: 09/12/22  1707      History   Chief Complaint Chief Complaint  Patient presents with   Foot Injury    HPI Jenna Combs is a 31 y.o. female.    Foot Injury  For pain in her posterior left ankle that is been going on for over 3 months.  No particular trauma noted prior to when it started.  She was seen here August 9 and provided a prescription for prednisone.  She does not feel that that improved her symptoms at all.   She has not gotten to go to the podiatry specialist as directed at that last visit.  Past Medical History:  Diagnosis Date   Asthma    Seizures [facility])     Patient Active Problem List   Diagnosis Date Noted   Vaginal odor 12/01/2020   Healthcare maintenance 12/01/2020   Generalized anxiety disorder 11/11/2020   Hidradenitis suppurativa 11/02/2020   BMI 30.0-30.9,adult 11/02/2020   Lumbar pain 10/29/2020   Tobacco use 10/29/2020   BMI 40.0-44.9, adult ([place]) 10/29/2020   Intermittent asthma without complication [id number]    Past Surgical History:  Procedure Laterality Date   BRAIN TUMOR EXCISION     As a child    OB History   No obstetric history on file.      Home Medications    Prior to Admission medications   Medication Sig Start Date End Date Taking? Authorizing Provider  ibuprofen (ADVIL) 800 MG tablet Take 1 tablet (800 mg total) by mouth every 8 (eight) hours as needed (pain). 09/12/22  Yes Barrett Henle, MD  acetaminophen (TYLENOL) 500 MG tablet Take 500-1,000 mg by mouth every 6 (six) hours as needed for mild pain or moderate pain.    [provider]  albuterol (PROVENTIL) (2.5 MG/3ML) 0.083% nebulizer solution Take 3 mLs (2.5 mg total) by nebulization every 6 (six) hours as needed for wheezing or shortness of breath. 06/26/19   Fulp, Cammie, MD  albuterol (VENTOLIN HFA) 108 (90 Base) MCG/ACT inhaler Inhale 1-2 puffs into the lungs every 6 (six) hours  as needed for wheezing or shortness of breath. 06/26/19   Fulp, Cammie, MD  ammonium lactate (LAC-HYDRIN) 12 % cream Apply topically as needed for dry skin. 02/11/20   Raylene Everts, MD  escitalopram (LEXAPRO) 10 MG tablet Take 1 tablet (10 mg total) by mouth daily. 11/09/20   Simmons-Robinson, Makiera, MD  fluticasone (FLOVENT HFA) 110 MCG/ACT inhaler Inhale 2 puffs into the lungs 2 (two) times daily. 06/26/19   Fulp, Cammie, MD  montelukast (SINGULAIR) 10 MG tablet Take 1 tablet (10 mg total) by mouth at bedtime for 30 days. 01/17/19 07/24/19  Chase Picket, MD    Family History Family History  Problem Relation Age of Onset   Diabetes Mother    Hypertension Father     Social History Social History   Tobacco Use   Smoking status: Some Days    Packs/day: 0.25    Years: 0.00    Total pack years: 0.00    Types: Cigarettes   Smokeless tobacco: Never  Vaping Use   Vaping Use: Never used  Substance Use Topics   Alcohol use: No   Drug use: No     Allergies   Mold extract [trichophyton]   Review of Systems Review of Systems   Physical Exam Triage Vital Signs ED Triage Vitals  Enc Vitals Group  BP 09/12/22 1823 123/81     Pulse Rate 09/12/22 1823 76     Resp 09/12/22 1823 12     Temp 09/12/22 1823 98.1 F (36.7 C)     Temp Source 09/12/22 1823 Oral     SpO2 09/12/22 1823 98 %     Weight --      Height --      Head Circumference --      Peak Flow --      Pain Score 09/12/22 1825 10     Pain Loc --      Pain Edu? --      Excl. in GC? --    No data found.  Updated Vital Signs BP 123/81 (BP Location: Right Arm)   Pulse 76   Temp 98.1 F (36.7 C) (Oral)   Resp 12   LMP 09/05/2022   SpO2 98%   Visual Acuity Right Eye Distance:   Left Eye Distance:   Bilateral Distance:    Right Eye Near:   Left Eye Near:    Bilateral Near:     Physical Exam Vitals reviewed.  Constitutional:      General: She is not in acute distress.    Appearance: She is not  toxic-appearing.  Cardiovascular:     Rate and Rhythm: Normal rate and regular rhythm.  Pulmonary:     Effort: Pulmonary effort is normal.     Breath sounds: Normal breath sounds.  Musculoskeletal:     Comments: Is mild tenderness of the Achilles tendon on the left ankle.  There is a little soft tissue swelling posterior to the medial malleolus.  There is no deformity.  Skin:    Coloration: Skin is not jaundiced or pale.  Neurological:     Mental Status: She is alert.      UC Treatments / Results  Labs (all labs ordered are listed, but only abnormal results are displayed) Labs Reviewed - No data to display  EKG   Radiology DG Ankle Complete Left  Result Date: 09/12/2022 CLINICAL DATA:  Intermittent left foot pain x3 months. EXAM: LEFT ANKLE COMPLETE - 3+ VIEW COMPARISON:  None Available. FINDINGS: There is no evidence of fracture, dislocation, or joint effusion. There is no evidence of arthropathy or other focal bone abnormality. An 8 mm superficial soft tissue defect is seen along the left heel. IMPRESSION: Superficial soft tissue defect along the left heel without an acute osseous abnormality. Electronically Signed   By: Aram Candela M.D.   On: 09/12/2022 19:31    Procedures Procedures (including critical care time)  Medications Ordered in UC Medications  ketorolac (TORADOL) 30 MG/ML injection 30 mg (has no administration in time range)    Initial Impression / Assessment and Plan / UC Course  I have reviewed the triage vital signs and the nursing notes.  Pertinent labs & imaging results that were available during my care of the patient were reviewed by me and considered in my medical decision making (see chart for details).        Patient states that she thinks she has fractured her foot; X-ray does not show any bony abnormality.  Pain relief is provided, and she is given contact information again for podiatry. Final Clinical Impressions(s) / UC Diagnoses    Final diagnoses:  Pain of left heel     Discharge Instructions      The x-ray did not show any bony abnormality  You have been given a shot of Toradol 30  mg today.  Take ibuprofen 800 mg--1 tab every 8 hours as needed for pain.  Put petroleum jelly on the dry skin on your heel, to 2 times a day     ED Prescriptions     Medication Sig Dispense Auth. Provider   ibuprofen (ADVIL) 800 MG tablet Take 1 tablet (800 mg total) by mouth every 8 (eight) hours as needed (pain). 21 tablet Zarrah Loveland, Gwenlyn Perking, MD      PDMP not reviewed this encounter.   Barrett Henle, MD 09/12/22 [phone]

## 2022-09-12 NOTE — ED Triage Notes (Signed)
Pt is here for pain in the left foot x 3 months on and off

## 2022-09-12 NOTE — Discharge Instructions (Signed)
The x-ray did not show any bony abnormality  You have been given a shot of Toradol 30 mg today.  Take ibuprofen 800 mg--1 tab every 8 hours as needed for pain.  Put petroleum jelly on the dry skin on your heel, to 2 times a day

## 2023-01-01 ENCOUNTER — Other Ambulatory Visit: Payer: Self-pay

## 2023-01-01 ENCOUNTER — Emergency Department (HOSPITAL_COMMUNITY)
Admission: EM | Admit: 2023-01-01 | Discharge: 2023-01-01 | Disposition: A | Discharge status: Home / Self Care | Payer: Medicaid Other | Attending: Emergency Medicine | Admitting: Emergency Medicine

## 2023-01-01 ENCOUNTER — Emergency Department (HOSPITAL_COMMUNITY): Payer: Medicaid Other

## 2023-01-01 DIAGNOSIS — J101 Influenza due to other identified influenza virus with other respiratory manifestations: Secondary | ICD-10-CM | POA: Diagnosis not present

## 2023-01-01 DIAGNOSIS — J45909 Unspecified asthma, uncomplicated: Secondary | ICD-10-CM | POA: Insufficient documentation

## 2023-01-01 DIAGNOSIS — R079 Chest pain, unspecified: Secondary | ICD-10-CM | POA: Diagnosis not present

## 2023-01-01 DIAGNOSIS — R0789 Other chest pain: Secondary | ICD-10-CM | POA: Diagnosis not present

## 2023-01-01 DIAGNOSIS — R0602 Shortness of breath: Secondary | ICD-10-CM | POA: Diagnosis not present

## 2023-01-01 DIAGNOSIS — Z1152 Encounter for screening for COVID-19: Secondary | ICD-10-CM | POA: Insufficient documentation

## 2023-01-01 LAB — BASIC METABOLIC PANEL
Anion gap: 7 (ref 5–15)
BUN: 6 mg/dL (ref 6–20)
CO2: 26 mmol/L (ref 22–32)
Calcium: 8.5 mg/dL — ABNORMAL LOW (ref 8.9–10.3)
Chloride: 103 mmol/L (ref 98–111)
Creatinine, Ser: 0.86 mg/dL (ref 0.44–1.00)
GFR, Estimated: >60 mL/min (ref >60)
Glucose, Bld: 117 mg/dL — ABNORMAL HIGH (ref 70–99)
Potassium: 3.5 mmol/L (ref 3.5–5.1)
Sodium: 136 mmol/L (ref 135–145)

## 2023-01-01 LAB — RESP PANEL BY RT-PCR (RSV, FLU A&B, COVID)  RVPGX2
Influenza A by PCR: NEGATIVE
Influenza B by PCR: POSITIVE — AB
Resp Syncytial Virus by PCR: NEGATIVE
SARS Coronavirus 2 by RT PCR: NEGATIVE

## 2023-01-01 LAB — CBC
HCT: 36 % (ref 36.0–46.0)
Hemoglobin: 12.2 g/dL (ref 12.0–15.0)
MCH: 30.3 pg (ref 26.0–34.0)
MCHC: 33.9 g/dL (ref 30.0–36.0)
MCV: 89.3 fL (ref 80.0–100.0)
Platelets: 217 10*3/uL (ref 150–400)
RBC: 4.03 MIL/uL (ref 3.87–5.11)
RDW: 14 % (ref 11.5–15.5)
WBC: 3.7 10*3/uL — ABNORMAL LOW (ref 4.0–10.5)
nRBC: 0 % (ref 0.0–0.2)

## 2023-01-01 LAB — TROPONIN I (HIGH SENSITIVITY): Troponin I (High Sensitivity): 6 ng/L (ref <18)

## 2023-01-01 MED ORDER — IPRATROPIUM-ALBUTEROL 0.5-2.5 (3) MG/3ML IN SOLN
3.0000 mL | Freq: Once | RESPIRATORY_TRACT | Status: AC
  Administered 2023-01-01: 3 mL via RESPIRATORY_TRACT
  Filled 2023-01-01: qty 3

## 2023-01-01 MED ORDER — HYDROCODONE BIT-HOMATROP MBR 5-1.5 MG/5ML PO SOLN
5.0000 mL | Freq: Four times a day (QID) | ORAL | 0 refills | Status: DC | PRN
Start: 2023-01-01 — End: 2024-06-28

## 2023-01-01 MED ORDER — ALBUTEROL SULFATE HFA 108 (90 BASE) MCG/ACT IN AERS: 2.0000 | INHALATION_SPRAY | RESPIRATORY_TRACT | Status: DC | PRN

## 2023-01-01 MED ORDER — HYDROCODONE BIT-HOMATROP MBR 5-1.5 MG/5ML PO SOLN
5.0000 mL | Freq: Four times a day (QID) | ORAL | 0 refills | Status: DC | PRN
Start: 2023-01-01 — End: 2023-01-01

## 2023-01-01 NOTE — Discharge Instructions (Addendum)
Your workup today shows you have the flu.  Given that you are 3 days out there is no benefit for Tamiflu.  Continue using your albuterol inhaler as you need to for wheezing and shortness of breath.  No concerning cause of your chest pain such as heart attack.  Drink plenty of fluids.  Continue taking Tylenol, and ibuprofen for fever and bodyaches.  Follow-up with your primary care provider.  If you do not have 1 you can establish 1 at [facility].  I have sent in a cough medication into the pharmacy for you.  Also pick up something called guaifenesin 1200 mg from the pharmacy to help break up any mucus.

## 2023-01-01 NOTE — ED Provider Notes (Signed)
[facility] Provider Note   CSN: [id number] Arrival date & time: 01/01/23  0544     History  Chief Complaint  Patient presents with   multiple complaints    Jenna Combs is a 32 y.o. female.  32 year old female with no significant past medical history presents today for evaluation of shortness of breath, chest pain, body aches ongoing for about 4 days.  1 episode of emesis following coughing spell earlier today.  She reports significant coughing spells.  Chest pain started secondary of the illness.  Denies personal history of cardiac disease.  Endorses family history of cardiac disease for mom.  She is unsure exactly what.  She states she does not keep it with her mom as much.  She believes she picked up this illness from her coworker.  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  Does have history of asthma.  The history is provided by the patient. No language interpreter was used.       Home Medications Prior to Admission medications   Medication Sig Start Date End Date Taking? Authorizing Provider  acetaminophen (TYLENOL) 500 MG tablet Take 500-1,000 mg by mouth every 6 (six) hours as needed for mild pain or moderate pain.    [provider]  albuterol (PROVENTIL) (2.5 MG/3ML) 0.083% nebulizer solution Take 3 mLs (2.5 mg total) by nebulization every 6 (six) hours as needed for wheezing or shortness of breath. 06/26/19   Fulp, Cammie, MD  albuterol (VENTOLIN HFA) 108 (90 Base) MCG/ACT inhaler Inhale 1-2 puffs into the lungs every 6 (six) hours as needed for wheezing or shortness of breath. 06/26/19   Fulp, Cammie, MD  ammonium lactate (LAC-HYDRIN) 12 % cream Apply topically as needed for dry skin. 02/11/20   Raylene Everts, MD  escitalopram (LEXAPRO) 10 MG tablet Take 1 tablet (10 mg total) by mouth daily. 11/09/20   Simmons-Robinson, Makiera, MD  fluticasone (FLOVENT HFA) 110 MCG/ACT inhaler Inhale 2 puffs into the  lungs 2 (two) times daily. 06/26/19   Fulp, Cammie, MD  ibuprofen (ADVIL) 800 MG tablet Take 1 tablet (800 mg total) by mouth every 8 (eight) hours as needed (pain). 09/12/22   Barrett Henle, MD  montelukast (SINGULAIR) 10 MG tablet Take 1 tablet (10 mg total) by mouth at bedtime for 30 days. 01/17/19 07/24/19  LampteyMyrene Galas, MD      Allergies    Mold extract [trichophyton]    Review of Systems   Review of Systems  Constitutional:  Negative for chills and fever.  HENT:  Positive for congestion. Negative for sore throat and trouble swallowing.   Respiratory:  Positive for cough and shortness of breath.   Cardiovascular:  Positive for chest pain.  Gastrointestinal:  Positive for vomiting. Negative for abdominal pain and nausea.  Neurological:  Negative for light-headedness.  All other systems reviewed and are negative.   Physical Exam Updated Vital Signs BP 94/63   Pulse 75   Temp 99.8 F (37.7 C) (Oral)   Resp 14   SpO2 98%  Physical Exam Vitals and nursing note reviewed.  Constitutional:      General: She is not in acute distress.    Appearance: Normal appearance. She is not ill-appearing.  HENT:     Head: Normocephalic and atraumatic.     Nose: Nose normal.  Eyes:     General: No scleral icterus.    Extraocular Movements: Extraocular movements intact.  Conjunctiva/sclera: Conjunctivae normal.  Cardiovascular:     Rate and Rhythm: Normal rate and regular rhythm.     Pulses: Normal pulses.  Pulmonary:     Effort: Pulmonary effort is normal. No respiratory distress.     Breath sounds: Normal breath sounds. No wheezing or rales.  Abdominal:     General: There is no distension.     Palpations: Abdomen is soft.     Tenderness: There is no abdominal tenderness.  Musculoskeletal:        General: Normal range of motion.     Cervical back: Normal range of motion.     Right lower leg: No edema.     Left lower leg: No edema.  Skin:    General: Skin is warm and dry.   Neurological:     General: No focal deficit present.     Mental Status: She is alert. Mental status is at baseline.    ED Results / Procedures / Treatments   Labs (all labs ordered are listed, but only abnormal results are displayed) Labs Reviewed  CBC - Abnormal; Notable for the following components:      Result Value   WBC 3.7 (*)    All other components within normal limits  RESP PANEL BY RT-PCR (RSV, FLU A&B, COVID)  RVPGX2  BASIC METABOLIC PANEL  TROPONIN I (HIGH SENSITIVITY)    EKG None  Radiology No results found.  Procedures Procedures    Medications Ordered in ED Medications  ipratropium-albuterol (DUONEB) 0.5-2.5 (3) MG/3ML nebulizer solution 3 mL (has no administration in time range)    ED Course/ Medical Decision Making/ A&P                             Medical Decision Making Amount and/or Complexity of Data Reviewed Labs: ordered. Radiology: ordered.  Risk Prescription drug management.   Medical Decision Making / ED Course   This patient presents to the ED for concern of chest pain, flulike symptoms, this involves an extensive number of treatment options, and is a complaint that carries with it a high risk of complications and morbidity.  The differential diagnosis includes viral URI, myocarditis, Naasz, pneumonia, MSK etiology  MDM: 32 year old female presents with above-mentioned complaints.  Overall she is well-appearing.  Multiple coughing spells throughout the interview.  Dry cough.  No evidence of pneumonia on chest x-ray.  Workup overall reassuring.  CBC with leukopenia 3.7, without anemia.  BMP with preserved renal function, normal electrolytes.  Initial troponin of 6.  Respiratory panel positive for flu.  EKG without acute ischemic changes.  Low suspicion for ACS, myocarditis.  Symptoms likely related to influenza.  Chest pain likely from the persistent coughing spells.  Will provide Hycodan for relief.  Discussed use of guaifenesin.  Other  symptomatic management discussed.  Strict return precaution discussed.  Discussed increasing hydration.  Patient voices understanding and is in agreement with plan.   Lab Tests: -I ordered, reviewed, and interpreted labs.   The pertinent results include:   Labs Reviewed  CBC - Abnormal; Notable for the following components:      Result Value   WBC 3.7 (*)    All other components within normal limits  RESP PANEL BY RT-PCR (RSV, FLU A&B, COVID)  RVPGX2  BASIC METABOLIC PANEL  TROPONIN I (HIGH SENSITIVITY)      EKG  EKG Interpretation  Date/Time:    Ventricular Rate:    PR Interval:  QRS Duration:   QT Interval:    QTC Calculation:   R Axis:     Text Interpretation:           Imaging Studies ordered: I ordered imaging studies including chest x-ray I independently visualized and interpreted imaging. I agree with the radiologist interpretation   Medicines ordered and prescription drug management: Meds ordered this encounter  Medications   DISCONTD: albuterol (VENTOLIN HFA) 108 (90 Base) MCG/ACT inhaler 2 puff   ipratropium-albuterol (DUONEB) 0.5-2.5 (3) MG/3ML nebulizer solution 3 mL    -I have reviewed the patients home medicines and have made adjustments as needed  Reevaluation: After the interventions noted above, I reevaluated the patient and found that they have :improved  Co morbidities that complicate the patient evaluation  Past Medical History:  Diagnosis Date   Asthma    Seizures ([place])       Dispostion: Patient discharged in stable condition.  Return precaution discussed.  Patient voices understanding and is in agreement with plan.  Final Clinical Impression(s) / ED Diagnoses Final diagnoses:  Influenza B    Rx / DC Orders ED Discharge Orders          Ordered    HYDROcodone bit-homatropine (HYCODAN) 5-1.5 MG/5ML syrup  Every 6 hours PRN        01/01/23 0750              Evlyn Courier, PA-C 01/01/23 0813    Deno Etienne,  DO 01/01/23 [phone]

## 2023-01-01 NOTE — ED Triage Notes (Signed)
Pt arrived from home via POV c/o SOB, chest pain, body aches, all started approx 4 days ago. Pt emesis  x1 today.

## 2023-01-01 NOTE — ED Notes (Signed)
Patient transported to x-ray. ?

## 2023-03-06 ENCOUNTER — Encounter (HOSPITAL_COMMUNITY): Payer: Self-pay | Admitting: Emergency Medicine

## 2023-03-06 ENCOUNTER — Other Ambulatory Visit: Payer: Self-pay

## 2023-03-06 ENCOUNTER — Ambulatory Visit (HOSPITAL_COMMUNITY)
Admission: EM | Admit: 2023-03-06 | Discharge: 2023-03-06 | Disposition: A | Discharge status: Home / Self Care | Payer: Medicaid Other | Attending: Internal Medicine | Admitting: Internal Medicine

## 2023-03-06 DIAGNOSIS — M5489 Other dorsalgia: Secondary | ICD-10-CM

## 2023-03-06 MED ORDER — METHOCARBAMOL 500 MG PO TABS: 500.0000 mg | ORAL_TABLET | Freq: Every evening | ORAL | 0 refills | Status: DC | PRN

## 2023-03-06 MED ORDER — IBUPROFEN 800 MG PO TABS: 800.0000 mg | ORAL_TABLET | Freq: Three times a day (TID) | ORAL | 0 refills | Status: DC

## 2023-03-06 NOTE — ED Triage Notes (Signed)
Patient is concerned about back, pain in lower back.  Patient feels "stiff".  Back issues have worsened over the last 2 weeks.  No known injury.    Patient is concerned that she has a achilles injury.  Pain for 6 months.  No known injury.  Patient reports pain and swelling to left ankle.    Patient has not had any medications for symptoms

## 2023-03-06 NOTE — ED Provider Notes (Signed)
[facility]    CSN: [id number] Arrival date & time: 03/06/23  1808      History   Chief Complaint Chief Complaint  Patient presents with   Back Pain    HPI Jenna Combs is a 32 y.o. female comes to the urgent care with lower back pain of 2 weeks duration.  Patient describes the pain as throbbing and of moderate severity.  Pain is aggravated by movement and is worse at the end of the working day.  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  No radiation of pain.  She has not tried any medications to help with her pain.  She denies any trauma or falls.  Patient endorses back stiffness.  Patient complains of left ankle pain.  She says she has a history of Achilles tendon injury.  She has had on and off pain in the left heel over the past 6 months.  She has not seen a physician for the symptoms.   HPI  Past Medical History:  Diagnosis Date   Asthma    Seizures ([facility])     Patient Active Problem List   Diagnosis Date Noted   Vaginal odor 12/01/2020   Healthcare maintenance 12/01/2020   Generalized anxiety disorder 11/11/2020   Hidradenitis suppurativa 11/02/2020   BMI 30.0-30.9,adult 11/02/2020   Lumbar pain 10/29/2020   Tobacco use 10/29/2020   BMI 40.0-44.9, adult (HCC) 10/29/2020   Intermittent asthma without complication 09/25/2018    Past Surgical History:  Procedure Laterality Date   BRAIN TUMOR EXCISION     As a child    OB History   No obstetric history on file.      Home Medications    Prior to Admission medications   Medication Sig Start Date End Date Taking? Authorizing Provider  ibuprofen (ADVIL) 800 MG tablet Take 1 tablet (800 mg total) by mouth 3 (three) times daily. 03/06/23  Yes Farra Nikolic, Britta Mccreedy, MD  methocarbamol (ROBAXIN) 500 MG tablet Take 1 tablet (500 mg total) by mouth at bedtime as needed for muscle spasms. 03/06/23  Yes Sarafina Puthoff, Britta Mccreedy, MD  acetaminophen (TYLENOL) 500 MG tablet Take 500-1,000 mg by mouth every 6  (six) hours as needed for mild pain or moderate pain.    [provider]  albuterol (PROVENTIL) (2.5 MG/3ML) 0.083% nebulizer solution Take 3 mLs (2.5 mg total) by nebulization every 6 (six) hours as needed for wheezing or shortness of breath. 06/26/19   Fulp, Cammie, MD  albuterol (VENTOLIN HFA) 108 (90 Base) MCG/ACT inhaler Inhale 1-2 puffs into the lungs every 6 (six) hours as needed for wheezing or shortness of breath. 06/26/19   Fulp, Cammie, MD  escitalopram (LEXAPRO) 10 MG tablet Take 1 tablet (10 mg total) by mouth daily. Patient not taking: Reported on 03/06/2023 11/09/20   Simmons-Robinson, Tawanna Cooler, MD  fluticasone (FLOVENT HFA) 110 MCG/ACT inhaler Inhale 2 puffs into the lungs 2 (two) times daily. Patient not taking: Reported on 03/06/2023 06/26/19   Fulp, Hewitt Shorts, MD  HYDROcodone bit-homatropine (HYCODAN) 5-1.5 MG/5ML syrup Take 5 mLs by mouth every 6 (six) hours as needed for cough. Patient not taking: Reported on 03/06/2023 01/01/23   Marita Kansas, PA-C  montelukast (SINGULAIR) 10 MG tablet Take 1 tablet (10 mg total) by mouth at bedtime for 30 days. Patient not taking: Reported on 03/06/2023 01/17/19 07/24/19  Merrilee Jansky, MD    Family History Family History  Problem Relation Age of Onset   Diabetes Mother    Hypertension  Father     Social History Social History   Tobacco Use   Smoking status: Some Days    Packs/day: 0.25    Years: 0.00    Additional pack years: 0.00    Total pack years: 0.00    Types: Cigarettes   Smokeless tobacco: Never  Vaping Use   Vaping Use: Never used  Substance Use Topics   Alcohol use: No   Drug use: No     Allergies   Mold extract [trichophyton]   Review of Systems Review of Systems As per HPI  Physical Exam Triage Vital Signs ED Triage Vitals  Enc Vitals Group     BP 03/06/23 1838 112/72     Pulse Rate 03/06/23 1838 93     Resp 03/06/23 1838 20     Temp 03/06/23 1838 99.9 F (37.7 C)     Temp Source 03/06/23 1838 Oral      SpO2 03/06/23 1838 96 %     Weight --      Height --      Head Circumference --      Peak Flow --      Pain Score 03/06/23 1834 10     Pain Loc --      Pain Edu? --      Excl. in GC? --    No data found.  Updated Vital Signs BP 112/72 (BP Location: Right Arm) Comment (BP Location): regular cuff to forearm  Pulse 93   Temp 99.9 F (37.7 C) (Oral)   Resp 20   LMP 02/26/2023   SpO2 96%   Visual Acuity Right Eye Distance:   Left Eye Distance:   Bilateral Distance:    Right Eye Near:   Left Eye Near:    Bilateral Near:     Physical Exam Vitals and nursing note reviewed.  Constitutional:      Appearance: She is not ill-appearing.  Cardiovascular:     Rate and Rhythm: Normal rate and regular rhythm.  Musculoskeletal:     Comments: Tenderness on palpation of the lower back.  Increased tone in the paraspinal muscles in the thoracolumbar region.  Mild swelling of the left ankle-no bruising over the left Achilles tendon.  Neurological:     Mental Status: She is alert.      UC Treatments / Results  Labs (all labs ordered are listed, but only abnormal results are displayed) Labs Reviewed - No data to display  EKG   Radiology No results found.  Procedures Procedures (including critical care time)  Medications Ordered in UC Medications - No data to display  Initial Impression / Assessment and Plan / UC Course  I have reviewed the triage vital signs and the nursing notes.  Pertinent labs & imaging results that were available during my care of the patient were reviewed by me and considered in my medical decision making (see chart for details).     1.  Back pain without sciatica: Ibuprofen 800 mg 3 times daily as needed for pain Robaxin 500 mg at bedtime as needed for muscle spasms Gentle range of motion exercises Heating pad use only 20 minutes on-20 minutes off Return precautions given. Final Clinical Impressions(s) / UC Diagnoses   Final diagnoses:   Back pain without sciatica     Discharge Instructions      Rest and elevate the affected painful area.  Apply warm compresses intermittently as needed.  He is apply the heating pad on a 20 minutes on-20 minutes  off cycle.  As pain recedes, begin normal activities slowly as tolerated.  Please take medications as prescribed.  If symptoms persist please call and make an appointment with orthopedic surgery for further evaluation and management.    ED Prescriptions     Medication Sig Dispense Auth. Provider   ibuprofen (ADVIL) 800 MG tablet Take 1 tablet (800 mg total) by mouth 3 (three) times daily. 21 tablet Airyn Ellzey, Britta Mccreedy, MD   methocarbamol (ROBAXIN) 500 MG tablet Take 1 tablet (500 mg total) by mouth at bedtime as needed for muscle spasms. 20 tablet Kaho Selle, Britta Mccreedy, MD      PDMP not reviewed this encounter.   Merrilee Jansky, MD 03/06/23 [phone]

## 2023-03-06 NOTE — Discharge Instructions (Addendum)
Rest and elevate the affected painful area.  Apply warm compresses intermittently as needed.  He is apply the heating pad on a 20 minutes on-20 minutes off cycle.  As pain recedes, begin normal activities slowly as tolerated.  Please take medications as prescribed.  If symptoms persist please call and make an appointment with orthopedic surgery for further evaluation and management.

## 2023-03-28 ENCOUNTER — Other Ambulatory Visit (INDEPENDENT_AMBULATORY_CARE_PROVIDER_SITE_OTHER): Payer: Medicaid Other

## 2023-03-28 ENCOUNTER — Encounter: Payer: Self-pay | Admitting: Orthopaedic Surgery

## 2023-03-28 ENCOUNTER — Ambulatory Visit (INDEPENDENT_AMBULATORY_CARE_PROVIDER_SITE_OTHER): Payer: Medicaid Other | Admitting: Orthopaedic Surgery

## 2023-03-28 DIAGNOSIS — G8929 Other chronic pain: Secondary | ICD-10-CM | POA: Diagnosis not present

## 2023-03-28 DIAGNOSIS — M545 Low back pain, unspecified: Secondary | ICD-10-CM | POA: Diagnosis not present

## 2023-03-28 DIAGNOSIS — M25572 Pain in left ankle and joints of left foot: Secondary | ICD-10-CM

## 2023-03-28 NOTE — Progress Notes (Signed)
Office Visit Note   Patient: Jenna Combs           Date of Birth: [birth year]           MRN: [id number] Visit Date: 03/28/2023              Requested by: Bess Kinds, MD [address] [place],  [place] [address] PCP: Bess Kinds, MD   Assessment & Plan: Visit Diagnoses:  1. Chronic midline low back pain, unspecified whether sciatica present   2. Pain in left ankle and joints of left foot     Plan: Impression is 32 year old female with axial low back pain and left Achilles tendinitis.  We will send her to physical therapy for both issues.  Recommend weight loss.  Work note provided with lifting restrictions for 6 weeks.  Follow-up as needed.  Follow-Up Instructions: No follow-ups on file.   Orders:  Orders Placed This Encounter  Procedures   XR Ankle 2 Views Left   XR Lumbar Spine 2-3 Views   Ambulatory referral to Physical Therapy   No orders of the defined types were placed in this encounter.     Procedures: No procedures performed   Clinical Data: No additional findings.   Subjective: Chief Complaint  Patient presents with   Lower Back - Pain   Left Ankle - Pain    HPI Jenna Combs is a 32 year old female here for evaluation of low back pain and left heel pain for 6 months.  Denies any injuries.  Her symptoms are worse when she stands at her job.  She states her feet feel like they are on fire at the end of the shift.  She has to lift heavy things at work which is causing the back and heel pain to flareup. Review of Systems  Constitutional: Negative.   HENT: Negative.    Eyes: Negative.   Respiratory: Negative.    Cardiovascular: Negative.   Endocrine: Negative.   Musculoskeletal: Negative.   Neurological: Negative.   Hematological: Negative.   Psychiatric/Behavioral: Negative.    All other systems reviewed and are negative.    Objective: Vital Signs: LMP 02/26/2023   Physical Exam Vitals and nursing note reviewed.  Constitutional:       Appearance: She is well-developed.  HENT:     Head: Atraumatic.     Nose: Nose normal.  Eyes:     Extraocular Movements: Extraocular movements intact.  Cardiovascular:     Pulses: Normal pulses.  Pulmonary:     Effort: Pulmonary effort is normal.  Abdominal:     Palpations: Abdomen is soft.  Musculoskeletal:     Cervical back: Neck supple.  Skin:    General: Skin is warm.     Capillary Refill: Capillary refill takes less than 2 seconds.  Neurological:     Mental Status: She is alert. Mental status is at baseline.  Psychiatric:        Behavior: Behavior normal.        Thought Content: Thought content normal.        Judgment: Judgment normal.    Ortho Exam Examination lumbar spine is nonfocal.  No tenderness palpation.  Normal range of motion.  No focal findings of the lower extremities.  Examination of the left ankle shows normal range of motion.  She is no reproducible pain with palpation along the Achilles.  He states that the pain is usually along the Achilles. Specialty Comments:  No specialty comments available.  Imaging: XR Ankle 2  Views Left  Result Date: 03/28/2023 No acute or structural abnormalities  XR Lumbar Spine 2-3 Views  Result Date: 03/28/2023 Lumbar spine x-rays show mild lumbar spondylosis.  No acute abnormalities.    PMFS History: Patient Active Problem List   Diagnosis Date Noted   Vaginal odor 12/01/2020   Healthcare maintenance 12/01/2020   Generalized anxiety disorder 11/11/2020   Hidradenitis suppurativa 11/02/2020   BMI 30.0-30.9,adult 11/02/2020   Lumbar pain 10/29/2020   Tobacco use 10/29/2020   BMI 40.0-44.9, adult (HCC) 10/29/2020   Intermittent asthma without complication 09/25/2018   Past Medical History:  Diagnosis Date   Asthma    Seizures (HCC)     Family History  Problem Relation Age of Onset   Diabetes Mother    Hypertension Father     Past Surgical History:  Procedure Laterality Date   BRAIN TUMOR EXCISION      As a child   Social History   Occupational History   Not on file  Tobacco Use   Smoking status: Some Days    Packs/day: 0.25    Years: 0.00    Additional pack years: 0.00    Total pack years: 0.00    Types: Cigarettes   Smokeless tobacco: Never  Vaping Use   Vaping Use: Never used  Substance and Sexual Activity   Alcohol use: No   Drug use: No   Sexual activity: Yes    Birth control/protection: None

## 2023-04-06 ENCOUNTER — Ambulatory Visit: Payer: Medicaid Other | Admitting: Podiatry

## 2023-04-06 ENCOUNTER — Encounter: Payer: Self-pay | Admitting: Podiatry

## 2023-04-06 DIAGNOSIS — R234 Changes in skin texture: Secondary | ICD-10-CM

## 2023-04-06 DIAGNOSIS — M7662 Achilles tendinitis, left leg: Secondary | ICD-10-CM | POA: Diagnosis not present

## 2023-04-06 MED ORDER — DEXAMETHASONE SODIUM PHOSPHATE 120 MG/30ML IJ SOLN
2.0000 mg | Freq: Once | INTRAMUSCULAR | Status: AC
  Administered 2023-04-06: 2 mg via INTRA_ARTICULAR

## 2023-04-06 MED ORDER — METHYLPREDNISOLONE 4 MG PO TBPK: ORAL_TABLET | ORAL | 0 refills | Status: DC

## 2023-04-06 MED ORDER — MELOXICAM 15 MG PO TABS: 15.0000 mg | ORAL_TABLET | Freq: Every day | ORAL | 3 refills | Status: DC

## 2023-04-06 NOTE — Progress Notes (Signed)
Subjective:  Patient ID: Jenna Combs, female    DOB: 06-15-1991,  MRN: [id number] HPI Chief Complaint  Patient presents with   Foot Pain    Achilles left - aching and swelling x 6 months or more, possible injury, tried aleve, seen urgent care-xrayed (neg findings), also went to Ortho for back and achilles issue-Rx'd Ibuprofen 800mg , but really was focused more on back at that visit   New Patient (Initial Visit)    Est pt 01/2020    32 y.o. female presents with the above complaint.   ROS: Denies fever chills nausea vomit muscle aches pains calf pain back pain chest pain shortness of breath.  Past Medical History:  Diagnosis Date   Asthma    Seizures (HCC)    Past Surgical History:  Procedure Laterality Date   BRAIN TUMOR EXCISION     As a child    Current Outpatient Medications:    meloxicam (MOBIC) 15 MG tablet, Take 1 tablet (15 mg total) by mouth daily., Disp: 30 tablet, Rfl: 3   methylPREDNISolone (MEDROL DOSEPAK) 4 MG TBPK tablet, 6 day dose pack - take as directed, Disp: 21 tablet, Rfl: 0   acetaminophen (TYLENOL) 500 MG tablet, Take 500-1,000 mg by mouth every 6 (six) hours as needed for mild pain or moderate pain., Disp: , Rfl:    albuterol (PROVENTIL) (2.5 MG/3ML) 0.083% nebulizer solution, Take 3 mLs (2.5 mg total) by nebulization every 6 (six) hours as needed for wheezing or shortness of breath., Disp: 200 mL, Rfl: 5   albuterol (VENTOLIN HFA) 108 (90 Base) MCG/ACT inhaler, Inhale 1-2 puffs into the lungs every 6 (six) hours as needed for wheezing or shortness of breath., Disp: 18 g, Rfl: 5   escitalopram (LEXAPRO) 10 MG tablet, Take 1 tablet (10 mg total) by mouth daily., Disp: 60 tablet, Rfl: 0   fluticasone (FLOVENT HFA) 110 MCG/ACT inhaler, Inhale 2 puffs into the lungs 2 (two) times daily., Disp: 1 Inhaler, Rfl: 5   HYDROcodone bit-homatropine (HYCODAN) 5-1.5 MG/5ML syrup, Take 5 mLs by mouth every 6 (six) hours as needed for cough., Disp: 120 mL, Rfl: 0    ibuprofen (ADVIL) 800 MG tablet, Take 1 tablet (800 mg total) by mouth 3 (three) times daily., Disp: 21 tablet, Rfl: 0   methocarbamol (ROBAXIN) 500 MG tablet, Take 1 tablet (500 mg total) by mouth at bedtime as needed for muscle spasms., Disp: 20 tablet, Rfl: 0   montelukast (SINGULAIR) 10 MG tablet, Take 1 tablet (10 mg total) by mouth at bedtime for 30 days. (Patient not taking: Reported on 03/06/2023), Disp: 30 tablet, Rfl: 1  No Known Allergies Review of Systems Objective:  There were no vitals filed for this visit.  General: Well developed, nourished, in no acute distress, alert and oriented x3   Dermatological: Skin is warm, dry and supple bilateral. Nails x 10 are well maintained; remaining integument appears unremarkable at this time. There are no open sores, no preulcerative lesions, no rash or signs of infection present.  Small fissured area on the posterior aspect of the calcaneus.  Noncomplicated nonbleeding  Vascular: Dorsalis Pedis artery and Posterior Tibial artery pedal pulses are 2/4 bilateral with immedate capillary fill time. Pedal hair growth present. No varicosities and no lower extremity edema present bilateral.   Neruologic: Grossly intact via light touch bilateral. Vibratory intact via tuning fork bilateral. Protective threshold with Semmes Wienstein monofilament intact to all pedal sites bilateral. Patellar and Achilles deep tendon reflexes 2+ bilateral. No Babinski  or clonus noted bilateral.   Musculoskeletal: No gross boney pedal deformities bilateral. No pain, crepitus, or limitation noted with foot and ankle range of motion bilateral. Muscular strength 5/5 in all groups tested bilateral.  Pain on palpation of the anterior portion of the Achilles right at the insertion of the posterior superior aspect of the left calcaneus.  No pain on medial lateral compression of the calcaneus  Gait: Unassisted, Nonantalgic.    Radiographs:  Achilles tendon reviewed demonstrates  the retrocalcaneal heel spur.  Assessment & Plan:   Assessment: Retrocalcaneal tendinitis or bursitis left heel.  Skin fissure left heel.  Plan: Discussed etiology pathology conservative surgical therapies at this point I injected dexamethasone and local anesthetic started on methylprednisolone to be followed by meloxicam.  I also recommended that she utilize O'Keefe's foot cream.  Follow-up with her in 4 to 6 weeks.  May need to consider MRI.     Sritha Chauncey T. [place], [place]

## 2023-04-17 NOTE — Therapy (Unsigned)
OUTPATIENT PHYSICAL THERAPY THORACOLUMBAR EVALUATION   Patient Name: Jenna Combs MRN: [id number] DOB:[birth year], 32 y.o., female Today's Date: 04/18/2023  END OF SESSION:  PT End of Session - 04/18/23 0934     Visit Number 1    Number of Visits 12    Date for PT Re-Evaluation 05/30/23    Authorization Type [employer]    PT Start Time 0932    PT Stop Time 1015    PT Time Calculation (min) 43 min    Activity Tolerance Patient tolerated treatment well    Behavior During Therapy [facility] for tasks assessed/performed             Past Medical History:  Diagnosis Date   Asthma    Seizures (HCC)    Past Surgical History:  Procedure Laterality Date   BRAIN TUMOR EXCISION     As a child   Patient Active Problem List   Diagnosis Date Noted   Vaginal odor 12/01/2020   Healthcare maintenance 12/01/2020   Generalized anxiety disorder 11/11/2020   Hidradenitis suppurativa 11/02/2020   BMI 30.0-30.9,adult 11/02/2020   Lumbar pain 10/29/2020   Tobacco use 10/29/2020   BMI 40.0-44.9, adult (HCC) 10/29/2020   Intermittent asthma without complication 09/25/2018    PCP: Bess Kinds MD   REFERRING PROVIDER: Gershon Mussel   REFERRING DIAG:  M54.50,G89.29 (ICD-10-CM) - Chronic midline low back pain, unspecified whether sciatica present  M25.572 (ICD-10-CM) - Pain in left ankle and joints of left foot    Rationale for Evaluation and Treatment: Rehabilitation  THERAPY DIAG:  No diagnosis found.  ONSET DATE: chronic   SUBJECTIVE:                                                                                                                                                                                           SUBJECTIVE STATEMENT: Pt has chronic low back pain.  She has had more severe pain in her back over the past year.  She has difficulty with work duties including sitting, standing and lifting. She does not have difficulty walking, she does go to the gym.   The pain does not radiate and she denies sensory changes or weakness.  Her L ankle/foot injury was > 6 mos ago. She did have a shot in her foot and took 1 day of the steroid pack.  She is not allowed to lift> 10 lbs.  She is off for 6 weeks.  Scheduled to go back to PT 05/09/23.   PERTINENT HISTORY:  None relevant   PAIN:  Are you having pain? Yes: NPRS scale: 8/10 Pain location: low back  Pain description: pressure, heaviness, achiness  Aggravating factors: lifting, sitting  Relieving factors: moving   PRECAUTIONS: None  WEIGHT BEARING RESTRICTIONS: No  FALLS:  Has patient fallen in last 6 months? No  LIVING ENVIRONMENT: Lives with: lives with an adult companionfriend  Lives in: House/apartment Stairs: Yes: Internal: no issue, has at work  steps; none  Has following equipment at home: None  OCCUPATION: Works full time 3rd shift , warehouse  , lifting required   PLOF: Independent  PATIENT GOALS: Pt would like to lift more without pain   NEXT MD VISIT: Sees Dr. Roda Shutters in the coming weeks as she goes back to work.   OBJECTIVE:   DIAGNOSTIC FINDINGS:  Result Date: 03/28/2023 Lumbar spine x-rays show mild lumbar spondylosis.  No acute abnormalities.   PATIENT SURVEYS:  FOTO back 42%, foot 51%   COGNITION: Overall cognitive status: Within functional limits for tasks assessed     SENSATION: WFL  MUSCLE LENGTH: Hamstrings: WNL Thomas test: WNL   POSTURE: increased lumbar lordosis  PALPATION: Pain along central lumbar and laterally L3-L4-L5. Normal spine mobility.  No spasm  LUMBAR ROM:   AROM eval  Flexion WFL  Extension Pain 50%   Right lateral flexion WFL  Left lateral flexion WFL  Right rotation WFL  Left rotation WFL    (Blank rows = not tested)  LOWER EXTREMITY ROM:   WNL   Active  Right eval Left eval  Hip flexion    Hip extension    Hip abduction    Hip adduction    Hip internal rotation    Hip external rotation    Knee flexion    Knee  extension    Ankle dorsiflexion    Ankle plantarflexion    Ankle inversion    Ankle eversion     (Blank rows = not tested)  LOWER EXTREMITY MMT:  WNL  MMT Right eval Left eval  Hip flexion    Hip extension    Hip abduction    Hip adduction    Hip internal rotation    Hip external rotation    Knee flexion    Knee extension    Ankle dorsiflexion    Ankle plantarflexion    Ankle inversion    Ankle eversion     (Blank rows = not tested)  LUMBAR SPECIAL TESTS:  Straight leg raise test: Negative, Single leg stance test: Negative, and NT   FUNCTIONAL TESTS:  NT  Pt with weakness in core, poor coordination of lumbopelvic complex, lacks awareness of core  Pt moving constantly during treatment, shifting weight and leaning back , side to side. Can demo lifting and squatting easily.   GAIT: Distance walked: 150 Assistive device utilized: None Level of assistance: Complete Independence Comments: no issues   TODAY'S TREATMENT:  DATE: 04/18/23    PATIENT EDUCATION:  Education details: Core, POC /PT  Person educated: Patient Education method: Explanation, Verbal cues, and Handouts  Education comprehension: verbalized understanding, returned demonstration, and needs further education  HOME EXERCISE PROGRAM: Access Code: [email or web address] URL: [email or web address] Date: 04/18/2023 Prepared by: Karie Mainland  Exercises - Seated Transversus Abdominis Bracing  - 1 x daily - 7 x weekly - 2 sets - 10 reps - 35 hold - Supine Transversus Abdominis Bracing - Hands on Stomach  - 1 x daily - 7 x weekly - 2 sets - 10 reps - 5 hold  ASSESSMENT:  CLINICAL IMPRESSION: Patient is a 32 y.o. female who was seen today for physical therapy evaluation and treatment for low back pain, chronic in addition to ankle and foot pain.  She repots her foot pain is nearly  resolved, she has not had much pain as she has not been working. Will focus on functional strengthening for her core and low back.   OBJECTIVE IMPAIRMENTS: decreased coordination, decreased mobility, decreased strength, postural dysfunction, obesity, and pain.   ACTIVITY LIMITATIONS: carrying, lifting, bending, sitting, and squatting  PARTICIPATION LIMITATIONS: community activity and occupation  PERSONAL FACTORS: Profession and Time since onset of injury/illness/exacerbation are also affecting patient's functional outcome.   REHAB POTENTIAL: Excellent  CLINICAL DECISION MAKING: Stable/uncomplicated  EVALUATION COMPLEXITY: Low   GOALS: Goals reviewed with patient? Yes   LONG TERM GOALS: Target date: 05/30/2023    Pt will be I with HEP for core and LEs  Baseline: unknown  Goal status: INITIAL  2.  Pt will be able to lift 25 lbs from the floor with good form and no increased back pain  Baseline: has not done this since being off of work Goal status: INITIAL  3.  Pt will go to the gym 2-3 x per week and incorporate core routine into her workout Baseline: does not work core  Goal status: INITIAL  4.  Pt will be able to sit for 30 min with no more than a min discomfort in her low back  Baseline: mod to severe right now.  Goal status: INITIAL  5.  FOTO lumbar will improve to 65% to demo improved functional mobility Baseline: 42% Goal status: INITIAL PLAN:  PT FREQUENCY: 2x/week  PT DURATION: 6 weeks  PLANNED INTERVENTIONS: Therapeutic exercises, Therapeutic activity, Neuromuscular re-education, Balance training, Gait training, Patient/Family education, Self Care, Joint mobilization, Dry Needling, Electrical stimulation, Spinal mobilization, Cryotherapy, Moist heat, Manual therapy, and Re-evaluation.  PLAN FOR NEXT SESSION: develop core (supine not well to today )   Ishika Chesterfield, PT 04/18/2023, 10:41 AM  Karie Mainland, PT 04/18/23 10:41 AM Phone: [phone] Fax:  [phone]   Check all possible CPT codes: 29562 - PT Re-evaluation, 97110- Therapeutic Exercise, [phone]- Neuro Re-education, 97140 - Manual Therapy, 97530 - Therapeutic Activities, 97535 - Self Care, 97014 - Electrical stimulation (unattended), and 97750 - Physical performance training    Check all conditions that are expected to impact treatment: {Conditions expected to impact treatment:None of these apply   If treatment provided at initial evaluation, no treatment charged due to lack of authorization.     Karie Mainland, PT 04/18/23 10:41 AM Phone: [phone] Fax: [phone]

## 2023-04-18 ENCOUNTER — Encounter: Payer: Self-pay | Admitting: Physical Therapy

## 2023-04-18 ENCOUNTER — Ambulatory Visit: Payer: Medicaid Other | Attending: Orthopaedic Surgery | Admitting: Physical Therapy

## 2023-04-18 DIAGNOSIS — M5459 Other low back pain: Secondary | ICD-10-CM | POA: Diagnosis not present

## 2023-04-18 DIAGNOSIS — G8929 Other chronic pain: Secondary | ICD-10-CM | POA: Diagnosis not present

## 2023-04-18 DIAGNOSIS — M545 Low back pain, unspecified: Secondary | ICD-10-CM | POA: Insufficient documentation

## 2023-04-18 DIAGNOSIS — M25572 Pain in left ankle and joints of left foot: Secondary | ICD-10-CM | POA: Diagnosis not present

## 2023-04-25 NOTE — Therapy (Signed)
OUTPATIENT PHYSICAL THERAPY THORACOLUMBAR   Patient Name: Jenna Combs MRN: [id number] DOB:1991-05-13, 32 y.o., female Today's Date: 04/25/2023  END OF SESSION:    Past Medical History:  Diagnosis Date   Asthma    Seizures (HCC)    Past Surgical History:  Procedure Laterality Date   BRAIN TUMOR EXCISION     As a child   Patient Active Problem List   Diagnosis Date Noted   Vaginal odor 12/01/2020   Healthcare maintenance 12/01/2020   Generalized anxiety disorder 11/11/2020   Hidradenitis suppurativa 11/02/2020   BMI 30.0-30.9,adult 11/02/2020   Lumbar pain 10/29/2020   Tobacco use 10/29/2020   BMI 40.0-44.9, adult (HCC) 10/29/2020   Intermittent asthma without complication 09/25/2018    PCP: Bess Kinds MD   REFERRING PROVIDER: Gershon Mussel   REFERRING DIAG:  M54.50,G89.29 (ICD-10-CM) - Chronic midline low back pain, unspecified whether sciatica present  M25.572 (ICD-10-CM) - Pain in left ankle and joints of left foot    Rationale for Evaluation and Treatment: Rehabilitation  THERAPY DIAG:  No diagnosis found.  ONSET DATE: chronic   SUBJECTIVE:                                                                                                                                                                                           SUBJECTIVE STATEMENT:  Eval:Pt has chronic low back pain.  She has had more severe pain in her back over the past year.  She has difficulty with work duties including sitting, standing and lifting. She does not have difficulty walking, she does go to the gym.  The pain does not radiate and she denies sensory changes or weakness.  Her L ankle/foot injury was > 6 mos ago. She did have a shot in her foot and took 1 day of the steroid pack.  She is not allowed to lift> 10 lbs.  She is off for 6 weeks.  Scheduled to go back to PT 05/09/23.   PERTINENT HISTORY:  None relevant   PAIN:  Are you having pain? Yes: NPRS scale:  8/10 Pain location: low back  Pain description: pressure, heaviness, achiness  Aggravating factors: lifting, sitting  Relieving factors: moving   PERTINENT HISTORY:  None relevant   PRECAUTIONS: None  WEIGHT BEARING RESTRICTIONS: No  FALLS:  Has patient fallen in last 6 months? No  LIVING ENVIRONMENT: Lives with: lives with an adult companionfriend  Lives in: House/apartment Stairs: Yes: Internal: no issue, has at work  steps; none  Has following equipment at home: None  OCCUPATION: Works full time 3rd shift , warehouse  , lifting required   PLOF:  Independent  PATIENT GOALS: Pt would like to lift more without pain   NEXT MD VISIT: Sees Dr. Roda Shutters in the coming weeks as she goes back to work.   OBJECTIVE:   DIAGNOSTIC FINDINGS:  Result Date: 03/28/2023 Lumbar spine x-rays show mild lumbar spondylosis.  No acute abnormalities.   PATIENT SURVEYS:  FOTO back 42%, foot 51%   COGNITION: Overall cognitive status: Within functional limits for tasks assessed     SENSATION: WFL  MUSCLE LENGTH: Hamstrings: WNL Thomas test: WNL   POSTURE: increased lumbar lordosis  PALPATION: Pain along central lumbar and laterally L3-L4-L5. Normal spine mobility.  No spasm  LUMBAR ROM:   AROM eval  Flexion WFL  Extension Pain 50%   Right lateral flexion WFL  Left lateral flexion WFL  Right rotation WFL  Left rotation WFL    (Blank rows = not tested)  LOWER EXTREMITY ROM:   WNL   Active  Right eval Left eval  Hip flexion    Hip extension    Hip abduction    Hip adduction    Hip internal rotation    Hip external rotation    Knee flexion    Knee extension    Ankle dorsiflexion    Ankle plantarflexion    Ankle inversion    Ankle eversion     (Blank rows = not tested)  LOWER EXTREMITY MMT:  WNL  MMT Right eval Left eval  Hip flexion    Hip extension    Hip abduction    Hip adduction    Hip internal rotation    Hip external rotation    Knee flexion    Knee  extension    Ankle dorsiflexion    Ankle plantarflexion    Ankle inversion    Ankle eversion     (Blank rows = not tested)  LUMBAR SPECIAL TESTS:  Straight leg raise test: Negative, Single leg stance test: Negative, and NT   FUNCTIONAL TESTS:  NT  Pt with weakness in core, poor coordination of lumbopelvic complex, lacks awareness of core  Pt moving constantly during treatment, shifting weight and leaning back , side to side. Can demo lifting and squatting easily.   GAIT: Distance walked: 150 Assistive device utilized: None Level of assistance: Complete Independence Comments: no issues   TODAY'S TREATMENT:   [facility] Adult PT Treatment:                                                DATE: 04/26/23 Therapeutic Exercise: *** Manual Therapy: *** Neuromuscular re-ed: *** Therapeutic Activity: *** Modalities: *** Self Care: ***                                                                                                                              DATE: 04/18/23    PATIENT  EDUCATION:  Education details: Core, POC /PT  Person educated: Patient Education method: Explanation, Verbal cues, and Handouts  Education comprehension: verbalized understanding, returned demonstration, and needs further education  HOME EXERCISE PROGRAM: Access Code: [email or web address] URL: [email or web address] Date: 04/18/2023 Prepared by: Karie Mainland  Exercises - Seated Transversus Abdominis Bracing  - 1 x daily - 7 x weekly - 2 sets - 10 reps - 35 hold - Supine Transversus Abdominis Bracing - Hands on Stomach  - 1 x daily - 7 x weekly - 2 sets - 10 reps - 5 hold  ASSESSMENT:  CLINICAL IMPRESSION:   Eval: Patient is a 32 y.o. female who was seen today for physical therapy evaluation and treatment for low back pain, chronic in addition to ankle and foot pain.  She repots her foot pain is nearly resolved, she has not had much pain as she has not been working. Will focus on functional  strengthening for her core and low back.   OBJECTIVE IMPAIRMENTS: decreased coordination, decreased mobility, decreased strength, postural dysfunction, obesity, and pain.   ACTIVITY LIMITATIONS: carrying, lifting, bending, sitting, and squatting  PARTICIPATION LIMITATIONS: community activity and occupation  PERSONAL FACTORS: Profession and Time since onset of injury/illness/exacerbation are also affecting patient's functional outcome.   REHAB POTENTIAL: Excellent  CLINICAL DECISION MAKING: Stable/uncomplicated  EVALUATION COMPLEXITY: Low   GOALS: Goals reviewed with patient? Yes   LONG TERM GOALS: Target date: 05/30/2023    Pt will be I with HEP for core and LEs  Baseline: unknown  Goal status: INITIAL  2.  Pt will be able to lift 25 lbs from the floor with good form and no increased back pain  Baseline: has not done this since being off of work Goal status: INITIAL  3.  Pt will go to the gym 2-3 x per week and incorporate core routine into her workout Baseline: does not work core  Goal status: INITIAL  4.  Pt will be able to sit for 30 min with no more than a min discomfort in her low back  Baseline: mod to severe right now.  Goal status: INITIAL  5.  FOTO lumbar will improve to 65% to demo improved functional mobility Baseline: 42% Goal status: INITIAL PLAN:  PT FREQUENCY: 2x/week  PT DURATION: 6 weeks  PLANNED INTERVENTIONS: Therapeutic exercises, Therapeutic activity, Neuromuscular re-education, Balance training, Gait training, Patient/Family education, Self Care, Joint mobilization, Dry Needling, Electrical stimulation, Spinal mobilization, Cryotherapy, Moist heat, Manual therapy, and Re-evaluation.  PLAN FOR NEXT SESSION: develop core (supine not well to today )   Joellyn Rued, PT 04/25/2023, 5:47 AM

## 2023-04-26 ENCOUNTER — Ambulatory Visit: Payer: Medicaid Other

## 2023-04-26 DIAGNOSIS — M5459 Other low back pain: Secondary | ICD-10-CM

## 2023-04-26 DIAGNOSIS — G8929 Other chronic pain: Secondary | ICD-10-CM | POA: Diagnosis not present

## 2023-04-26 DIAGNOSIS — M25572 Pain in left ankle and joints of left foot: Secondary | ICD-10-CM | POA: Diagnosis not present

## 2023-04-26 DIAGNOSIS — M545 Low back pain, unspecified: Secondary | ICD-10-CM | POA: Diagnosis not present

## 2023-04-26 NOTE — Therapy (Signed)
OUTPATIENT PHYSICAL THERAPY THORACOLUMBAR   Patient Name: Jenna Combs MRN: [id number] DOB:[birth year], 32 y.o., female Today's Date: 04/27/2023  END OF SESSION:  PT End of Session - 04/27/23 0901     Visit Number 3    Number of Visits 12    Date for PT Re-Evaluation 05/30/23    Authorization Type [employer]    PT Start Time 0800    PT Stop Time 0844    PT Time Calculation (min) 44 min    Activity Tolerance Patient tolerated treatment well    Behavior During Therapy [facility] for tasks assessed/performed               Past Medical History:  Diagnosis Date   Asthma    Seizures (HCC)    Past Surgical History:  Procedure Laterality Date   BRAIN TUMOR EXCISION     As a child   Patient Active Problem List   Diagnosis Date Noted   Vaginal odor 12/01/2020   Healthcare maintenance 12/01/2020   Generalized anxiety disorder 11/11/2020   Hidradenitis suppurativa 11/02/2020   BMI 30.0-30.9,adult 11/02/2020   Lumbar pain 10/29/2020   Tobacco use 10/29/2020   BMI 40.0-44.9, adult (HCC) 10/29/2020   Intermittent asthma without complication 09/25/2018    PCP: Bess Kinds MD   REFERRING PROVIDER: Gershon Mussel   REFERRING DIAG:  M54.50,G89.29 (ICD-10-CM) - Chronic midline low back pain, unspecified whether sciatica present  M25.572 (ICD-10-CM) - Pain in left ankle and joints of left foot    Rationale for Evaluation and Treatment: Rehabilitation  THERAPY DIAG:  Other low back pain  ONSET DATE: chronic   SUBJECTIVE:                                                                                                                                                                                           SUBJECTIVE STATEMENT: Pt reports she worked out at [employer] for an hour yesterday and it went well. Pt notes later that evening she experienced significant LBP pain. This pain was relieved by aleve. She is having no pain this AM.  Eval:Pt has chronic low back  pain.  She has had more severe pain in her back over the past year.  She has difficulty with work duties including sitting, standing and lifting. She does not have difficulty walking, she does go to the gym.  The pain does not radiate and she denies sensory changes or weakness.  Her L ankle/foot injury was > 6 mos ago. She did have a shot in her foot and took 1 day of the steroid pack.  She is not allowed to  lift> 10 lbs.  She is off for 6 weeks.  Scheduled to go back to PT 05/09/23.   PERTINENT HISTORY:  None relevant   PAIN:  Are you having pain? Yes: NPRS scale: 0/10 Pain location: low back  Pain description: pressure, heaviness, achiness  Aggravating factors: lifting, sitting  Relieving factors: moving  General pain range: 0-8/10  PERTINENT HISTORY:  None relevant   PRECAUTIONS: None  WEIGHT BEARING RESTRICTIONS: No  FALLS:  Has patient fallen in last 6 months? No  LIVING ENVIRONMENT: Lives with: lives with an adult companionfriend  Lives in: House/apartment Stairs: Yes: Internal: no issue, has at work  steps; none  Has following equipment at home: None  OCCUPATION: Works full time 3rd shift , warehouse  , lifting required   PLOF: Independent  PATIENT GOALS: Pt would like to lift more without pain   NEXT MD VISIT: Sees Dr. Roda Shutters in the coming weeks as she goes back to work.   OBJECTIVE:   DIAGNOSTIC FINDINGS:  Result Date: 03/28/2023 Lumbar spine x-rays show mild lumbar spondylosis.  No acute abnormalities.   PATIENT SURVEYS:  FOTO back 42%, foot 51%   COGNITION: Overall cognitive status: Within functional limits for tasks assessed     SENSATION: WFL  MUSCLE LENGTH: Hamstrings: WNL Thomas test: WNL   POSTURE: increased lumbar lordosis  PALPATION: Pain along central lumbar and laterally L3-L4-L5. Normal spine mobility.  No spasm  LUMBAR ROM:   AROM eval  Flexion WFL  Extension Pain 50%   Right lateral flexion WFL  Left lateral flexion WFL  Right  rotation WFL  Left rotation WFL    (Blank rows = not tested)  LOWER EXTREMITY ROM:   WNL   Active  Right eval Left eval  Hip flexion    Hip extension    Hip abduction    Hip adduction    Hip internal rotation    Hip external rotation    Knee flexion    Knee extension    Ankle dorsiflexion    Ankle plantarflexion    Ankle inversion    Ankle eversion     (Blank rows = not tested)  LOWER EXTREMITY MMT:  WNL  MMT Right eval Left eval  Hip flexion    Hip extension    Hip abduction    Hip adduction    Hip internal rotation    Hip external rotation    Knee flexion    Knee extension    Ankle dorsiflexion    Ankle plantarflexion    Ankle inversion    Ankle eversion     (Blank rows = not tested)  LUMBAR SPECIAL TESTS:  Straight leg raise test: Negative, Single leg stance test: Negative, and NT   FUNCTIONAL TESTS:  NT  Pt with weakness in core, poor coordination of lumbopelvic complex, lacks awareness of core  Pt moving constantly during treatment, shifting weight and leaning back , side to side. Can demo lifting and squatting easily.   GAIT: Distance walked: 150 Assistive device utilized: None Level of assistance: Complete Independence Comments: no issues   TODAY'S TREATMENT:   [facility] Adult PT Treatment:                                                DATE: 04/27/23 Therapeutic Exercise: Nustep 5 mins L5 Supine PPT x10 5 sec Supine dead  bug legs only Standing shoulder ext 2x10 BluTB  Standing shoulder row 2x10 BluTB  Side steps with palloff press double BluTG x5  3 rep/trip each Hinged hip STS c dowel cue x10, 10# x10 Q'ped alt arm lifts x10 each, kick backs x10 Cat/camel Updated HEP Self Care: Pt Ed re: hinged hip lifting c dowel cue and also for proper posture with sitting with and without support  [facility] Adult PT Treatment:                                                DATE: 04/26/23 Therapeutic Exercise: Nustep 5 mins L5 Seated PPT x10 5 sec Supine PPT  x10 5 sec Supine dead bug legs only SKTC, LTR, and seated trunk flexion with swiss ball did not resolve pt's feeling of tightness Plantigrade hip ext 2x10 Standing shoulder ext 2x10 BluTB  Updated HEP                                                                                                                      DATE: 04/18/23    PATIENT EDUCATION:  Education details: Core, POC /PT  Person educated: Patient Education method: Explanation, Verbal cues, and Handouts  Education comprehension: verbalized understanding, returned demonstration, and needs further education  HOME EXERCISE PROGRAM: Access Code: [email or web address] URL: [email or web address] Date: 04/27/2023 Prepared by: Joellyn Rued  Exercises - Seated Transversus Abdominis Bracing  - 1 x daily - 7 x weekly - 2 sets - 10 reps - 35 hold - Supine Transversus Abdominis Bracing - Hands on Stomach  - 1 x daily - 7 x weekly - 2 sets - 10 reps - 5 hold - Supine March  - 1 x daily - 7 x weekly - 2 sets - 10 reps - Standing Hip Extension with Chair  - 1 x daily - 7 x weekly - 2 sets - 10 reps - 2 hold - Shoulder Extension with Resistance  - 1 x daily - 7 x weekly - 2 sets - 10 reps - 2 hold - Standing Shoulder Row with Anchored Resistance  - 1 x daily - 7 x weekly - 2 sets - 10 reps - 2 hold - Anti-Rotation Sidestepping with Resistance  - 1 x daily - 7 x weekly - 2 sets - 10 reps - 2 hold - Quadruped Alternating Arm Lift  - 1 x daily - 7 x weekly - 2 sets - 10 reps - 2 hold - Quadruped Alternating Leg Extensions  - 1 x daily - 7 x weekly - 2 sets - 10 reps - 2 hold  ASSESSMENT:  CLINICAL IMPRESSION: PT focused on abdominal and trunk ext strengthening. Following therex for the trunk extensioners, pt reported low back stiffness. Cat/camel therex was helpful to decrease the stiffness. Pt tolerated the prescribed therex without adverse effects. Pt's HEP was updated. Pt will continue to  benefit from skilled PT to address impairments  for improved back function with less pain.    Eval: Patient is a 32 y.o. female who was seen today for physical therapy evaluation and treatment for low back pain, chronic in addition to ankle and foot pain.  She repots her foot pain is nearly resolved, she has not had much pain as she has not been working. Will focus on functional strengthening for her core and low back.   OBJECTIVE IMPAIRMENTS: decreased coordination, decreased mobility, decreased strength, postural dysfunction, obesity, and pain.   ACTIVITY LIMITATIONS: carrying, lifting, bending, sitting, and squatting  PARTICIPATION LIMITATIONS: community activity and occupation  PERSONAL FACTORS: Profession and Time since onset of injury/illness/exacerbation are also affecting patient's functional outcome.   REHAB POTENTIAL: Excellent  CLINICAL DECISION MAKING: Stable/uncomplicated  EVALUATION COMPLEXITY: Low   GOALS: Goals reviewed with patient? Yes   LONG TERM GOALS: Target date: 05/30/2023    Pt will be I with HEP for core and LEs  Baseline: unknown  Goal status: Ongoing  2.  Pt will be able to lift 25 lbs from the floor with good form and no increased back pain  Baseline: has not done this since being off of work Goal status: INITIAL  3.  Pt will go to the gym 2-3 x per week and incorporate core routine into her workout Baseline: does not work core  Goal status: INITIAL  4.  Pt will be able to sit for 30 min with no more than a min discomfort in her low back  Baseline: mod to severe right now.  Goal status: INITIAL  5.  FOTO lumbar will improve to 65% to demo improved functional mobility Baseline: 42% Goal status: INITIAL PLAN:  PT FREQUENCY: 2x/week  PT DURATION: 6 weeks  PLANNED INTERVENTIONS: Therapeutic exercises, Therapeutic activity, Neuromuscular re-education, Balance training, Gait training, Patient/Family education, Self Care, Joint mobilization, Dry Needling, Electrical stimulation, Spinal  mobilization, Cryotherapy, Moist heat, Manual therapy, and Re-evaluation.  PLAN FOR NEXT SESSION: develop core (supine not well to today )   Joellyn Rued MS, PT 04/27/23 9:19 AM

## 2023-04-27 ENCOUNTER — Ambulatory Visit: Payer: Medicaid Other

## 2023-04-27 DIAGNOSIS — M25572 Pain in left ankle and joints of left foot: Secondary | ICD-10-CM | POA: Diagnosis not present

## 2023-04-27 DIAGNOSIS — G8929 Other chronic pain: Secondary | ICD-10-CM | POA: Diagnosis not present

## 2023-04-27 DIAGNOSIS — M5459 Other low back pain: Secondary | ICD-10-CM | POA: Diagnosis not present

## 2023-04-27 DIAGNOSIS — M545 Low back pain, unspecified: Secondary | ICD-10-CM | POA: Diagnosis not present

## 2023-04-28 ENCOUNTER — Ambulatory Visit: Payer: Medicaid Other | Admitting: Nurse Practitioner

## 2023-05-02 NOTE — Therapy (Incomplete)
OUTPATIENT PHYSICAL THERAPY THORACOLUMBAR Treatment Note   Patient Name: Jenna Combs MRN: [id number] DOB:December 22, 1990, 32 y.o., female Today's Date: 05/02/2023  END OF SESSION:      Past Medical History:  Diagnosis Date   Asthma    Seizures (HCC)    Past Surgical History:  Procedure Laterality Date   BRAIN TUMOR EXCISION     As a child   Patient Active Problem List   Diagnosis Date Noted   Vaginal odor 12/01/2020   Healthcare maintenance 12/01/2020   Generalized anxiety disorder 11/11/2020   Hidradenitis suppurativa 11/02/2020   BMI 30.0-30.9,adult 11/02/2020   Lumbar pain 10/29/2020   Tobacco use 10/29/2020   BMI 40.0-44.9, adult (HCC) 10/29/2020   Intermittent asthma without complication 09/25/2018    PCP: Bess Kinds MD   REFERRING PROVIDER: Gershon Mussel   REFERRING DIAG:  M54.50,G89.29 (ICD-10-CM) - Chronic midline low back pain, unspecified whether sciatica present  M25.572 (ICD-10-CM) - Pain in left ankle and joints of left foot    Rationale for Evaluation and Treatment: Rehabilitation  THERAPY DIAG:  No diagnosis found.  ONSET DATE: chronic   SUBJECTIVE:                                                                                                                                                                                           SUBJECTIVE STATEMENT: Pt reports she worked out at [employer] for an hour yesterday and it went well. Pt notes later that evening she experienced significant LBP pain. This pain was relieved by aleve. She is having no pain this AM.  Eval:Pt has chronic low back pain.  She has had more severe pain in her back over the past year.  She has difficulty with work duties including sitting, standing and lifting. She does not have difficulty walking, she does go to the gym.  The pain does not radiate and she denies sensory changes or weakness.  Her L ankle/foot injury was > 6 mos ago. She did have a shot in her foot and  took 1 day of the steroid pack.  She is not allowed to lift> 10 lbs.  She is off for 6 weeks.  Scheduled to go back to PT 05/09/23.   PERTINENT HISTORY:  None relevant   PAIN:  Are you having pain? Yes: NPRS scale: 0/10 Pain location: low back  Pain description: pressure, heaviness, achiness  Aggravating factors: lifting, sitting  Relieving factors: moving  General pain range: 0-8/10  PERTINENT HISTORY:  None relevant   PRECAUTIONS: None  WEIGHT BEARING RESTRICTIONS: No  FALLS:  Has patient fallen in last 6 months? No  LIVING ENVIRONMENT: Lives with: lives with an adult companionfriend  Lives in: House/apartment Stairs: Yes: Internal: no issue, has at work  steps; none  Has following equipment at home: None  OCCUPATION: Works full time 3rd shift , warehouse  , lifting required   PLOF: Independent  PATIENT GOALS: Pt would like to lift more without pain   NEXT MD VISIT: Sees Dr. Roda Shutters in the coming weeks as she goes back to work.   OBJECTIVE:   DIAGNOSTIC FINDINGS:  Result Date: 03/28/2023 Lumbar spine x-rays show mild lumbar spondylosis.  No acute abnormalities.   PATIENT SURVEYS:  FOTO back 42%, foot 51%   COGNITION: Overall cognitive status: Within functional limits for tasks assessed     SENSATION: WFL  MUSCLE LENGTH: Hamstrings: WNL Thomas test: WNL   POSTURE: increased lumbar lordosis  PALPATION: Pain along central lumbar and laterally L3-L4-L5. Normal spine mobility.  No spasm  LUMBAR ROM:   AROM eval  Flexion WFL  Extension Pain 50%   Right lateral flexion WFL  Left lateral flexion WFL  Right rotation WFL  Left rotation WFL    (Blank rows = not tested)  LOWER EXTREMITY ROM:   WNL   Active  Right eval Left eval  Hip flexion    Hip extension    Hip abduction    Hip adduction    Hip internal rotation    Hip external rotation    Knee flexion    Knee extension    Ankle dorsiflexion    Ankle plantarflexion    Ankle inversion    Ankle  eversion     (Blank rows = not tested)  LOWER EXTREMITY MMT:  WNL  MMT Right eval Left eval  Hip flexion    Hip extension    Hip abduction    Hip adduction    Hip internal rotation    Hip external rotation    Knee flexion    Knee extension    Ankle dorsiflexion    Ankle plantarflexion    Ankle inversion    Ankle eversion     (Blank rows = not tested)  LUMBAR SPECIAL TESTS:  Straight leg raise test: Negative, Single leg stance test: Negative, and NT   FUNCTIONAL TESTS:  NT  Pt with weakness in core, poor coordination of lumbopelvic complex, lacks awareness of core  Pt moving constantly during treatment, shifting weight and leaning back , side to side. Can demo lifting and squatting easily.   GAIT: Distance walked: 150 Assistive device utilized: None Level of assistance: Complete Independence Comments: no issues   TODAY'S TREATMENT:   [facility] Adult PT Treatment:                                                DATE: 05/04/23 Therapeutic Exercise: *** Manual Therapy: *** Neuromuscular re-ed: *** Therapeutic Activity: *** Modalities: *** Self Care: ***   [facility] Adult PT Treatment:                                                DATE: 04/27/23 Therapeutic Exercise: Nustep 5 mins L5 Supine PPT x10 5 sec Supine dead bug legs only Standing shoulder ext 2x10 BluTB  Standing shoulder row 2x10 BluTB  Side  steps with palloff press double BluTG x5  3 rep/trip each Hinged hip STS c dowel cue x10, 10# x10 Q'ped alt arm lifts x10 each, kick backs x10 Cat/camel Updated HEP Self Care: Pt Ed re: hinged hip lifting c dowel cue and also for proper posture with sitting with and without support  [facility] Adult PT Treatment:                                                DATE: 04/26/23 Therapeutic Exercise: Nustep 5 mins L5 Seated PPT x10 5 sec Supine PPT x10 5 sec Supine dead bug legs only SKTC, LTR, and seated trunk flexion with swiss ball did not resolve pt's feeling of  tightness Plantigrade hip ext 2x10 Standing shoulder ext 2x10 BluTB  Updated HEP                                                                                                                      DATE: 04/18/23    PATIENT EDUCATION:  Education details: Core, POC /PT  Person educated: Patient Education method: Explanation, Verbal cues, and Handouts  Education comprehension: verbalized understanding, returned demonstration, and needs further education  HOME EXERCISE PROGRAM: Access Code: [email or web address] URL: [email or web address] Date: 04/27/2023 Prepared by: Joellyn Rued  Exercises - Seated Transversus Abdominis Bracing  - 1 x daily - 7 x weekly - 2 sets - 10 reps - 35 hold - Supine Transversus Abdominis Bracing - Hands on Stomach  - 1 x daily - 7 x weekly - 2 sets - 10 reps - 5 hold - Supine March  - 1 x daily - 7 x weekly - 2 sets - 10 reps - Standing Hip Extension with Chair  - 1 x daily - 7 x weekly - 2 sets - 10 reps - 2 hold - Shoulder Extension with Resistance  - 1 x daily - 7 x weekly - 2 sets - 10 reps - 2 hold - Standing Shoulder Row with Anchored Resistance  - 1 x daily - 7 x weekly - 2 sets - 10 reps - 2 hold - Anti-Rotation Sidestepping with Resistance  - 1 x daily - 7 x weekly - 2 sets - 10 reps - 2 hold - Quadruped Alternating Arm Lift  - 1 x daily - 7 x weekly - 2 sets - 10 reps - 2 hold - Quadruped Alternating Leg Extensions  - 1 x daily - 7 x weekly - 2 sets - 10 reps - 2 hold  ASSESSMENT:  CLINICAL IMPRESSION: PT focused on abdominal and trunk ext strengthening. Following therex for the trunk extensioners, pt reported low back stiffness. Cat/camel therex was helpful to decrease the stiffness. Pt tolerated the prescribed therex without adverse effects. Pt's HEP was updated. Pt will continue to benefit from skilled PT to address impairments for improved back function with less pain.  Eval: Patient is a 32 y.o. female who was seen today for physical therapy  evaluation and treatment for low back pain, chronic in addition to ankle and foot pain.  She repots her foot pain is nearly resolved, she has not had much pain as she has not been working. Will focus on functional strengthening for her core and low back.   OBJECTIVE IMPAIRMENTS: decreased coordination, decreased mobility, decreased strength, postural dysfunction, obesity, and pain.   ACTIVITY LIMITATIONS: carrying, lifting, bending, sitting, and squatting  PARTICIPATION LIMITATIONS: community activity and occupation  PERSONAL FACTORS: Profession and Time since onset of injury/illness/exacerbation are also affecting patient's functional outcome.   REHAB POTENTIAL: Excellent  CLINICAL DECISION MAKING: Stable/uncomplicated  EVALUATION COMPLEXITY: Low   GOALS: Goals reviewed with patient? Yes   LONG TERM GOALS: Target date: 05/30/2023    Pt will be I with HEP for core and LEs  Baseline: unknown  Goal status: Ongoing  2.  Pt will be able to lift 25 lbs from the floor with good form and no increased back pain  Baseline: has not done this since being off of work Goal status: INITIAL  3.  Pt will go to the gym 2-3 x per week and incorporate core routine into her workout Baseline: does not work core  Goal status: INITIAL  4.  Pt will be able to sit for 30 min with no more than a min discomfort in her low back  Baseline: mod to severe right now.  Goal status: INITIAL  5.  FOTO lumbar will improve to 65% to demo improved functional mobility Baseline: 42% Goal status: INITIAL PLAN:  PT FREQUENCY: 2x/week  PT DURATION: 6 weeks  PLANNED INTERVENTIONS: Therapeutic exercises, Therapeutic activity, Neuromuscular re-education, Balance training, Gait training, Patient/Family education, Self Care, Joint mobilization, Dry Needling, Electrical stimulation, Spinal mobilization, Cryotherapy, Moist heat, Manual therapy, and Re-evaluation.  PLAN FOR NEXT SESSION: develop core (supine not well  to today )   Joellyn Rued MS, PT 05/02/23 8:01 AM

## 2023-05-03 ENCOUNTER — Encounter: Payer: Self-pay | Admitting: Physical Therapy

## 2023-05-03 ENCOUNTER — Ambulatory Visit: Payer: Medicaid Other | Admitting: Physical Therapy

## 2023-05-03 DIAGNOSIS — M545 Low back pain, unspecified: Secondary | ICD-10-CM | POA: Diagnosis not present

## 2023-05-03 DIAGNOSIS — M25572 Pain in left ankle and joints of left foot: Secondary | ICD-10-CM | POA: Diagnosis not present

## 2023-05-03 DIAGNOSIS — G8929 Other chronic pain: Secondary | ICD-10-CM | POA: Diagnosis not present

## 2023-05-03 DIAGNOSIS — M5459 Other low back pain: Secondary | ICD-10-CM | POA: Diagnosis not present

## 2023-05-03 NOTE — Therapy (Addendum)
OUTPATIENT PHYSICAL THERAPY THORACOLUMBAR DISCHARGE   Patient Name: Jenna Combs MRN: [id number] DOB:05/22/1991, 32 y.o., female Today's Date: 05/03/2023     PHYSICAL THERAPY DISCHARGE SUMMARY  Visits from Start of Care: 4  Current functional level related to goals / functional outcomes: See below    Remaining deficits: Unknown    Education / Equipment: HEP, core , body mechanics   Patient agrees to discharge. Patient goals were not met. Patient is being discharged due to not returning since the last visit.  Karie Mainland, PT 05/26/23 9:23 AM Phone: [phone] Fax: [phone]    END OF SESSION:  PT End of Session - 05/03/23 0801     Visit Number 4    Number of Visits 12    Date for PT Re-Evaluation 05/30/23    Authorization Type [employer]    Authorization Time Period 7/10/06/24/23    Authorization - Visit Number 3    Authorization - Number of Visits 6    PT Start Time 0800    PT Stop Time 0838    PT Time Calculation (min) 38 min               Past Medical History:  Diagnosis Date   Asthma    Seizures (HCC)    Past Surgical History:  Procedure Laterality Date   BRAIN TUMOR EXCISION     As a child   Patient Active Problem List   Diagnosis Date Noted   Vaginal odor 12/01/2020   Healthcare maintenance 12/01/2020   Generalized anxiety disorder 11/11/2020   Hidradenitis suppurativa 11/02/2020   BMI 30.0-30.9,adult 11/02/2020   Lumbar pain 10/29/2020   Tobacco use 10/29/2020   BMI 40.0-44.9, adult (HCC) 10/29/2020   Intermittent asthma without complication 09/25/2018    PCP: Bess Kinds MD   REFERRING PROVIDER: Gershon Mussel   REFERRING DIAG:  M54.50,G89.29 (ICD-10-CM) - Chronic midline low back pain, unspecified whether sciatica present  M25.572 (ICD-10-CM) - Pain in left ankle and joints of left foot    Rationale for Evaluation and Treatment: Rehabilitation  THERAPY DIAG:  Other low back pain  ONSET DATE: chronic    SUBJECTIVE:                                                                                                                                                                                           SUBJECTIVE STATEMENT: Pt reports that she does not think PT is going to help because she is already active and exercises regularly.   Eval:Pt has chronic low back pain.  She has had more severe pain in her back over the past year.  She has difficulty with work duties including sitting, standing and lifting. She does not have difficulty walking, she does go to the gym.  The pain does not radiate and she denies sensory changes or weakness.  Her L ankle/foot injury was > 6 mos ago. She did have a shot in her foot and took 1 day of the steroid pack.  She is not allowed to lift> 10 lbs.  She is off for 6 weeks.  Scheduled to go back to PT 05/09/23.   PERTINENT HISTORY:  None relevant   PAIN:  Are you having pain? Yes: NPRS scale: 0/10 Pain location: low back  Pain description: pressure, heaviness, achiness  Aggravating factors: lifting, sitting  Relieving factors: moving  General pain range: 0-8/10  PERTINENT HISTORY:  None relevant   PRECAUTIONS: None  WEIGHT BEARING RESTRICTIONS: No  FALLS:  Has patient fallen in last 6 months? No  LIVING ENVIRONMENT: Lives with: lives with an adult companionfriend  Lives in: House/apartment Stairs: Yes: Internal: no issue, has at work  steps; none  Has following equipment at home: None  OCCUPATION: Works full time 3rd shift , warehouse  , lifting required   PLOF: Independent  PATIENT GOALS: Pt would like to lift more without pain   NEXT MD VISIT: Sees Dr. Roda Shutters in the coming weeks as she goes back to work.   OBJECTIVE:   DIAGNOSTIC FINDINGS:  Result Date: 03/28/2023 Lumbar spine x-rays show mild lumbar spondylosis.  No acute abnormalities.   PATIENT SURVEYS:  FOTO back 42%, foot 51%   COGNITION: Overall cognitive status: Within functional  limits for tasks assessed     SENSATION: WFL  MUSCLE LENGTH: Hamstrings: WNL Thomas test: WNL   POSTURE: increased lumbar lordosis  PALPATION: Pain along central lumbar and laterally L3-L4-L5. Normal spine mobility.  No spasm  LUMBAR ROM:   AROM eval  Flexion WFL  Extension Pain 50%   Right lateral flexion WFL  Left lateral flexion WFL  Right rotation WFL  Left rotation WFL    (Blank rows = not tested)  LOWER EXTREMITY ROM:   WNL   Active  Right eval Left eval  Hip flexion    Hip extension    Hip abduction    Hip adduction    Hip internal rotation    Hip external rotation    Knee flexion    Knee extension    Ankle dorsiflexion    Ankle plantarflexion    Ankle inversion    Ankle eversion     (Blank rows = not tested)  LOWER EXTREMITY MMT:  WNL  MMT Right eval Left eval  Hip flexion    Hip extension    Hip abduction    Hip adduction    Hip internal rotation    Hip external rotation    Knee flexion    Knee extension    Ankle dorsiflexion    Ankle plantarflexion    Ankle inversion    Ankle eversion     (Blank rows = not tested)  LUMBAR SPECIAL TESTS:  Straight leg raise test: Negative, Single leg stance test: Negative, and NT   FUNCTIONAL TESTS:  NT  Pt with weakness in core, poor coordination of lumbopelvic complex, lacks awareness of core  Pt moving constantly during treatment, shifting weight and leaning back , side to side. Can demo lifting and squatting easily.   GAIT: Distance walked: 150 Assistive device utilized: None Level of assistance: Complete Independence Comments: no issues   TODAY'S TREATMENT:  [facility] Adult PT Treatment:                                                DATE: 05/03/23 Therapeutic Exercise: Standing pull down  yellow spring 5 sec x 10 Palloff press  10# 10 x 2 - Free motion Downward chops with dowel attachment 10# x 10 each way -Free motion Seated lumbar flexion stretch with ball 10 sec x 5  Forearm plank 5 sec  best x 5  Side plank 10 sec x 2 each  Bird Dog with oblique crunch x 5 each way Plank on forearms and toes- 5 sec best, repeated x 5 Side plank on forearms and knees 10 sec x 2 each      [facility] Adult PT Treatment:                                                DATE: 04/27/23 Therapeutic Exercise: Nustep 5 mins L5 Supine PPT x10 5 sec Supine dead bug legs only Standing shoulder ext 2x10 BluTB  Standing shoulder row 2x10 BluTB  Side steps with palloff press double BluTG x5  3 rep/trip each Hinged hip STS c dowel cue x10, 10# x10 Q'ped alt arm lifts x10 each, kick backs x10 Cat/camel Updated HEP Self Care: Pt Ed re: hinged hip lifting c dowel cue and also for proper posture with sitting with and without support  [facility] Adult PT Treatment:                                                DATE: 04/26/23 Therapeutic Exercise: Nustep 5 mins L5 Seated PPT x10 5 sec Supine PPT x10 5 sec Supine dead bug legs only SKTC, LTR, and seated trunk flexion with swiss ball did not resolve pt's feeling of tightness Plantigrade hip ext 2x10 Standing shoulder ext 2x10 BluTB  Updated HEP                                                                                                                      DATE: 04/18/23    PATIENT EDUCATION:  Education details: Core, POC /PT  Person educated: Patient Education method: Explanation, Verbal cues, and Handouts  Education comprehension: verbalized understanding, returned demonstration, and needs further education  HOME EXERCISE PROGRAM: Access Code: [email or web address] URL: [email or web address] Date: 04/27/2023 Prepared by: Joellyn Rued  Exercises - Seated Transversus Abdominis Bracing  - 1 x daily - 7 x weekly - 2 sets - 10 reps - 35 hold - Supine Transversus Abdominis Bracing - Hands on Stomach  - 1 x daily - 7 x weekly -  2 sets - 10 reps - 5 hold - Supine March  - 1 x daily - 7 x weekly - 2 sets - 10 reps - Standing Hip Extension with Chair  - 1  x daily - 7 x weekly - 2 sets - 10 reps - 2 hold - Shoulder Extension with Resistance  - 1 x daily - 7 x weekly - 2 sets - 10 reps - 2 hold - Standing Shoulder Row with Anchored Resistance  - 1 x daily - 7 x weekly - 2 sets - 10 reps - 2 hold - Anti-Rotation Sidestepping with Resistance  - 1 x daily - 7 x weekly - 2 sets - 10 reps - 2 hold - Quadruped Alternating Arm Lift  - 1 x daily - 7 x weekly - 2 sets - 10 reps - 2 hold - Quadruped Alternating Leg Extensions  - 1 x daily - 7 x weekly - 2 sets - 10 reps - 2 hold Added forearm planks and side planks  ASSESSMENT:  CLINICAL IMPRESSION: Pt reports she is unable to perform supine exercises. Continued with core strengthening in various positions. Increased pain began with pallof press and chops. She was able to hold forearm plank 5 seconds and side plank from knees 10 seconds. These were verbally added to her HEP. She has 3 more covered visits and will RTW. Will plan to finalize HEP. Pt will continue to benefit from skilled PT to address impairments for improved back function with less pain.    Eval: Patient is a 32 y.o. female who was seen today for physical therapy evaluation and treatment for low back pain, chronic in addition to ankle and foot pain.  She repots her foot pain is nearly resolved, she has not had much pain as she has not been working. Will focus on functional strengthening for her core and low back.   OBJECTIVE IMPAIRMENTS: decreased coordination, decreased mobility, decreased strength, postural dysfunction, obesity, and pain.   ACTIVITY LIMITATIONS: carrying, lifting, bending, sitting, and squatting  PARTICIPATION LIMITATIONS: community activity and occupation  PERSONAL FACTORS: Profession and Time since onset of injury/illness/exacerbation are also affecting patient's functional outcome.   REHAB POTENTIAL: Excellent  CLINICAL DECISION MAKING: Stable/uncomplicated  EVALUATION COMPLEXITY: Low   GOALS: Goals reviewed  with patient? Yes   LONG TERM GOALS: Target date: 05/30/2023    Pt will be I with HEP for core and LEs  Baseline: unknown  Goal status: Ongoing  2.  Pt will be able to lift 25 lbs from the floor with good form and no increased back pain  Baseline: has not done this since being off of work Goal status: INITIAL  3.  Pt will go to the gym 2-3 x per week and incorporate core routine into her workout Baseline: does not work core  Goal status: INITIAL  4.  Pt will be able to sit for 30 min with no more than a min discomfort in her low back  Baseline: mod to severe right now.  Goal status: INITIAL  5.  FOTO lumbar will improve to 65% to demo improved functional mobility Baseline: 42% Goal status: INITIAL PLAN:  PT FREQUENCY: 2x/week  PT DURATION: 6 weeks  PLANNED INTERVENTIONS: Therapeutic exercises, Therapeutic activity, Neuromuscular re-education, Balance training, Gait training, Patient/Family education, Self Care, Joint mobilization, Dry Needling, Electrical stimulation, Spinal mobilization, Cryotherapy, Moist heat, Manual therapy, and Re-evaluation.  PLAN FOR NEXT SESSION: develop core (unable to tolerate supine) , continue planks    Jannette Spanner,  PTA 05/03/23 8:42 AM Phone: [phone] Fax: [phone]

## 2023-05-04 ENCOUNTER — Ambulatory Visit: Payer: Medicaid Other

## 2023-05-05 NOTE — Therapy (Deleted)
OUTPATIENT PHYSICAL THERAPY THORACOLUMBAR   Patient Name: Jenna Combs MRN: [id number] DOB:23-Dec-1990, 32 y.o., female Today's Date: 05/05/2023  END OF SESSION:      Past Medical History:  Diagnosis Date   Asthma    Seizures (HCC)    Past Surgical History:  Procedure Laterality Date   BRAIN TUMOR EXCISION     As a child   Patient Active Problem List   Diagnosis Date Noted   Vaginal odor 12/01/2020   Healthcare maintenance 12/01/2020   Generalized anxiety disorder 11/11/2020   Hidradenitis suppurativa 11/02/2020   BMI 30.0-30.9,adult 11/02/2020   Lumbar pain 10/29/2020   Tobacco use 10/29/2020   BMI 40.0-44.9, adult (HCC) 10/29/2020   Intermittent asthma without complication 09/25/2018    PCP: Bess Kinds MD   REFERRING PROVIDER: Gershon Mussel   REFERRING DIAG:  M54.50,G89.29 (ICD-10-CM) - Chronic midline low back pain, unspecified whether sciatica present  M25.572 (ICD-10-CM) - Pain in left ankle and joints of left foot    Rationale for Evaluation and Treatment: Rehabilitation  THERAPY DIAG:  No diagnosis found.  ONSET DATE: chronic   SUBJECTIVE:                                                                                                                                                                                           SUBJECTIVE STATEMENT: Pt reports that she does not think PT is going to help because she is already active and exercises regularly.   Eval:Pt has chronic low back pain.  She has had more severe pain in her back over the past year.  She has difficulty with work duties including sitting, standing and lifting. She does not have difficulty walking, she does go to the gym.  The pain does not radiate and she denies sensory changes or weakness.  Her L ankle/foot injury was > 6 mos ago. She did have a shot in her foot and took 1 day of the steroid pack.  She is not allowed to lift> 10 lbs.  She is off for 6 weeks.  Scheduled to go  back to PT 05/09/23.   PERTINENT HISTORY:  None relevant   PAIN:  Are you having pain? Yes: NPRS scale: 0/10 Pain location: low back  Pain description: pressure, heaviness, achiness  Aggravating factors: lifting, sitting  Relieving factors: moving  General pain range: 0-8/10  PERTINENT HISTORY:  None relevant   PRECAUTIONS: None  WEIGHT BEARING RESTRICTIONS: No  FALLS:  Has patient fallen in last 6 months? No  LIVING ENVIRONMENT: Lives with: lives with an adult companionfriend  Lives in: House/apartment Stairs: Yes: Internal: no issue, has at  work  steps; none  Has following equipment at home: None  OCCUPATION: Works full time 3rd shift , warehouse  , lifting required   PLOF: Independent  PATIENT GOALS: Pt would like to lift more without pain   NEXT MD VISIT: Sees Dr. Roda Shutters in the coming weeks as she goes back to work.   OBJECTIVE:   DIAGNOSTIC FINDINGS:  Result Date: 03/28/2023 Lumbar spine x-rays show mild lumbar spondylosis.  No acute abnormalities.   PATIENT SURVEYS:  FOTO back 42%, foot 51%   COGNITION: Overall cognitive status: Within functional limits for tasks assessed     SENSATION: WFL  MUSCLE LENGTH: Hamstrings: WNL Thomas test: WNL   POSTURE: increased lumbar lordosis  PALPATION: Pain along central lumbar and laterally L3-L4-L5. Normal spine mobility.  No spasm  LUMBAR ROM:   AROM eval  Flexion WFL  Extension Pain 50%   Right lateral flexion WFL  Left lateral flexion WFL  Right rotation WFL  Left rotation WFL    (Blank rows = not tested)  LOWER EXTREMITY ROM:   WNL   Active  Right eval Left eval  Hip flexion    Hip extension    Hip abduction    Hip adduction    Hip internal rotation    Hip external rotation    Knee flexion    Knee extension    Ankle dorsiflexion    Ankle plantarflexion    Ankle inversion    Ankle eversion     (Blank rows = not tested)  LOWER EXTREMITY MMT:  WNL  MMT Right eval Left eval  Hip  flexion    Hip extension    Hip abduction    Hip adduction    Hip internal rotation    Hip external rotation    Knee flexion    Knee extension    Ankle dorsiflexion    Ankle plantarflexion    Ankle inversion    Ankle eversion     (Blank rows = not tested)  LUMBAR SPECIAL TESTS:  Straight leg raise test: Negative, Single leg stance test: Negative, and NT   FUNCTIONAL TESTS:  NT  Pt with weakness in core, poor coordination of lumbopelvic complex, lacks awareness of core  Pt moving constantly during treatment, shifting weight and leaning back , side to side. Can demo lifting and squatting easily.   GAIT: Distance walked: 150 Assistive device utilized: None Level of assistance: Complete Independence Comments: no issues   TODAY'S TREATMENT: [facility] Adult PT Treatment:                                                DATE: 05/05/23 Therapeutic Exercise: *** Manual Therapy: *** Neuromuscular re-ed: *** Therapeutic Activity: *** Modalities: *** Self Care: ***    [facility] Adult PT Treatment:                                                DATE: 05/03/23 Therapeutic Exercise: Standing pull down  yellow spring 5 sec x 10 Palloff press  10# 10 x 2 - Free motion Downward chops with dowel attachment 10# x 10 each way -Free motion Seated lumbar flexion stretch with ball 10 sec x 5  Forearm plank 5 sec best  x 5  Side plank 10 sec x 2 each  Bird Dog with oblique crunch x 5 each way Plank on forearms and toes- 5 sec best, repeated x 5 Side plank on forearms and knees 10 sec x 2 each  [facility] Adult PT Treatment:                                                DATE: 04/27/23 Therapeutic Exercise: Nustep 5 mins L5 Supine PPT x10 5 sec Supine dead bug legs only Standing shoulder ext 2x10 BluTB  Standing shoulder row 2x10 BluTB  Side steps with palloff press double BluTG x5  3 rep/trip each Hinged hip STS c dowel cue x10, 10# x10 Q'ped alt arm lifts x10 each, kick backs  x10 Cat/camel Updated HEP Self Care: Pt Ed re: hinged hip lifting c dowel cue and also for proper posture with sitting with and without support  [facility] Adult PT Treatment:                                                DATE: 04/26/23 Therapeutic Exercise: Nustep 5 mins L5 Seated PPT x10 5 sec Supine PPT x10 5 sec Supine dead bug legs only SKTC, LTR, and seated trunk flexion with swiss ball did not resolve pt's feeling of tightness Plantigrade hip ext 2x10 Standing shoulder ext 2x10 BluTB  Updated HEP                                                                                                                      DATE: 04/18/23    PATIENT EDUCATION:  Education details: Core, POC /PT  Person educated: Patient Education method: Explanation, Verbal cues, and Handouts  Education comprehension: verbalized understanding, returned demonstration, and needs further education  HOME EXERCISE PROGRAM: Access Code: [email or web address] URL: [email or web address] Date: 04/27/2023 Prepared by: Joellyn Rued  Exercises - Seated Transversus Abdominis Bracing  - 1 x daily - 7 x weekly - 2 sets - 10 reps - 35 hold - Supine Transversus Abdominis Bracing - Hands on Stomach  - 1 x daily - 7 x weekly - 2 sets - 10 reps - 5 hold - Supine March  - 1 x daily - 7 x weekly - 2 sets - 10 reps - Standing Hip Extension with Chair  - 1 x daily - 7 x weekly - 2 sets - 10 reps - 2 hold - Shoulder Extension with Resistance  - 1 x daily - 7 x weekly - 2 sets - 10 reps - 2 hold - Standing Shoulder Row with Anchored Resistance  - 1 x daily - 7 x weekly - 2 sets - 10 reps - 2 hold - Anti-Rotation Sidestepping with Resistance  - 1  x daily - 7 x weekly - 2 sets - 10 reps - 2 hold - Quadruped Alternating Arm Lift  - 1 x daily - 7 x weekly - 2 sets - 10 reps - 2 hold - Quadruped Alternating Leg Extensions  - 1 x daily - 7 x weekly - 2 sets - 10 reps - 2 hold Added forearm planks and side  planks  ASSESSMENT:  CLINICAL IMPRESSION: Pt reports she is unable to perform supine exercises. Continued with core strengthening in various positions. Increased pain began with pallof press and chops. She was able to hold forearm plank 5 seconds and side plank from knees 10 seconds. These were verbally added to her HEP. She has 3 more covered visits and will RTW. Will plan to finalize HEP. Pt will continue to benefit from skilled PT to address impairments for improved back function with less pain.    Eval: Patient is a 32 y.o. female who was seen today for physical therapy evaluation and treatment for low back pain, chronic in addition to ankle and foot pain.  She repots her foot pain is nearly resolved, she has not had much pain as she has not been working. Will focus on functional strengthening for her core and low back.   OBJECTIVE IMPAIRMENTS: decreased coordination, decreased mobility, decreased strength, postural dysfunction, obesity, and pain.   ACTIVITY LIMITATIONS: carrying, lifting, bending, sitting, and squatting  PARTICIPATION LIMITATIONS: community activity and occupation  PERSONAL FACTORS: Profession and Time since onset of injury/illness/exacerbation are also affecting patient's functional outcome.   REHAB POTENTIAL: Excellent  CLINICAL DECISION MAKING: Stable/uncomplicated  EVALUATION COMPLEXITY: Low   GOALS: Goals reviewed with patient? Yes   LONG TERM GOALS: Target date: 05/30/2023    Pt will be I with HEP for core and LEs  Baseline: unknown  Goal status: Ongoing  2.  Pt will be able to lift 25 lbs from the floor with good form and no increased back pain  Baseline: has not done this since being off of work Goal status: INITIAL  3.  Pt will go to the gym 2-3 x per week and incorporate core routine into her workout Baseline: does not work core  Goal status: INITIAL  4.  Pt will be able to sit for 30 min with no more than a min discomfort in her low back   Baseline: mod to severe right now.  Goal status: INITIAL  5.  FOTO lumbar will improve to 65% to demo improved functional mobility Baseline: 42% Goal status: INITIAL PLAN:  PT FREQUENCY: 2x/week  PT DURATION: 6 weeks  PLANNED INTERVENTIONS: Therapeutic exercises, Therapeutic activity, Neuromuscular re-education, Balance training, Gait training, Patient/Family education, Self Care, Joint mobilization, Dry Needling, Electrical stimulation, Spinal mobilization, Cryotherapy, Moist heat, Manual therapy, and Re-evaluation.  PLAN FOR NEXT SESSION: develop core (unable to tolerate supine) , continue planks    [employer] MS, PT 05/05/23 8:06 AM

## 2023-05-08 ENCOUNTER — Ambulatory Visit: Payer: Medicaid Other | Admitting: Physical Therapy

## 2023-05-09 ENCOUNTER — Ambulatory Visit: Payer: Medicaid Other | Admitting: Physical Therapy

## 2023-05-09 ENCOUNTER — Encounter: Payer: Self-pay | Admitting: Physician Assistant

## 2023-05-09 ENCOUNTER — Ambulatory Visit: Payer: Medicaid Other | Admitting: Physician Assistant

## 2023-05-09 DIAGNOSIS — G8929 Other chronic pain: Secondary | ICD-10-CM

## 2023-05-09 DIAGNOSIS — M545 Low back pain, unspecified: Secondary | ICD-10-CM | POA: Diagnosis not present

## 2023-05-09 NOTE — Addendum Note (Signed)
Addended by: Wendi Maya on: 05/09/2023 11:00 AM   Modules accepted: Orders

## 2023-05-09 NOTE — Progress Notes (Signed)
Office Visit Note   Patient: Jenna Combs           Date of Birth: November 09, 1990           MRN: [id number] Visit Date: 05/09/2023              Requested by: Bess Kinds, MD [address] [place],  [place] [address] PCP: Bess Kinds, MD   Assessment & Plan: Visit Diagnoses:  1. Chronic midline low back pain, unspecified whether sciatica present     Plan: Impression is chronic midline low back pain.  At this point, symptoms have been ongoing for several months and have not improved with prescription steroids, NSAIDs or greater than 6 weeks of outpatient physical therapy.  I would like to go ahead and order an MRI to assess for structural abnormalities.  She will follow-up with Ellin Goodie to discuss the MRI and for further treatment recommendation.  Call with concerns or questions in the meantime.  Follow-Up Instructions: Return for f/u with megan williams after MRI.   Orders:  No orders of the defined types were placed in this encounter.  No orders of the defined types were placed in this encounter.     Procedures: No procedures performed   Clinical Data: No additional findings.   Subjective: Chief Complaint  Patient presents with   Left Ankle - Follow-up   Lower Back - Follow-up    HPI patient is a pleasant 32 year old female who comes in today with continued low back pain.  This is been ongoing for few months now.  She was seen in our office in early June where she was started on steroids and sent to physical therapy.  She has had continued pain to the middle of the low back without radiation down either leg.  Pain is constant but worse going from seated to standing position.  She has most recently been taking NSAIDs without significant relief.  She denies any paresthesias.  No bowel or bladder dysfunction.  Review of Systems as detailed in HPI.  All others reviewed and are negative.   Objective: Vital Signs: There were no vitals taken for this  visit.  Physical Exam well-developed well-nourished female in no acute distress.  Alert and oriented x 3.  Ortho Exam lumbar spine exam: She does have tenderness to the lower lumbar spine.  No paraspinous musculature tenderness.  No pain with lumbar flexion or extension.  Negative straight leg raise both sides.  No focal weakness.  She is neurovascular intact distally.  Specialty Comments:  No specialty comments available.  Imaging: No new imaging   PMFS History: Patient Active Problem List   Diagnosis Date Noted   Vaginal odor 12/01/2020   Healthcare maintenance 12/01/2020   Generalized anxiety disorder 11/11/2020   Hidradenitis suppurativa 11/02/2020   BMI 30.0-30.9,adult 11/02/2020   Lumbar pain 10/29/2020   Tobacco use 10/29/2020   BMI 40.0-44.9, adult (HCC) 10/29/2020   Intermittent asthma without complication 09/25/2018   Past Medical History:  Diagnosis Date   Asthma    Seizures (HCC)     Family History  Problem Relation Age of Onset   Diabetes Mother    Hypertension Father     Past Surgical History:  Procedure Laterality Date   BRAIN TUMOR EXCISION     As a child   Social History   Occupational History   Not on file  Tobacco Use   Smoking status: Some Days    Current packs/day: 0.25  Types: Cigarettes   Smokeless tobacco: Never  Vaping Use   Vaping status: Never Used  Substance and Sexual Activity   Alcohol use: No   Drug use: No   Sexual activity: Yes    Birth control/protection: None

## 2023-05-16 ENCOUNTER — Ambulatory Visit: Payer: Medicaid Other

## 2023-05-17 ENCOUNTER — Ambulatory Visit: Payer: Medicaid Other | Admitting: Physical Therapy

## 2023-05-22 ENCOUNTER — Encounter (HOSPITAL_BASED_OUTPATIENT_CLINIC_OR_DEPARTMENT_OTHER): Payer: Self-pay

## 2023-05-22 ENCOUNTER — Telehealth: Payer: Self-pay | Admitting: Orthopaedic Surgery

## 2023-05-22 NOTE — Telephone Encounter (Signed)
Patient called wanting to know if you can extend her going back to work because they didn't do her MRI [phone]

## 2023-05-22 NOTE — Telephone Encounter (Signed)
Called pt. Her followup for her MRI is on 8/27. OOW note completed until that followup. Pt stated understanding to this

## 2023-05-22 NOTE — Telephone Encounter (Signed)
Sure as long as within a reasonable time

## 2023-06-06 ENCOUNTER — Telehealth: Payer: Self-pay | Admitting: Orthopaedic Surgery

## 2023-06-06 NOTE — Telephone Encounter (Signed)
Tried to call patient. No answer. Okay to return to work per Dr.Xu. I just need to know return back to work date. Will try patient again later.

## 2023-06-06 NOTE — Telephone Encounter (Signed)
Pt called requesting to return to work. Pt is going to drop off a fitness form for PA Keddie or Dr Roda Shutters. Pt states she trying to drop form off today and need form by Thursday to return to her employer. Please call pt ASAP at [phone]. Last note looks like PA Dub Mikes gave note for pt to be out of work

## 2023-06-06 NOTE — Telephone Encounter (Signed)
Yes

## 2023-06-06 NOTE — Telephone Encounter (Signed)
Patient was seen in the office on 05/09/2023 for lower back pain. MRI was ordered and scheduled to be done on 06/08/2023. She was originally written out of work until 06/13/2023. Okay to return?

## 2023-06-06 NOTE — Telephone Encounter (Signed)
Pt came in to drop off fitness for duty form needs to be completed, bringing back to [facility]

## 2023-06-06 NOTE — Telephone Encounter (Signed)
Notified patient and left form up front for pick up.

## 2023-06-08 ENCOUNTER — Ambulatory Visit
Admission: RE | Admit: 2023-06-08 | Discharge: 2023-06-08 | Disposition: A | Discharge status: Home / Self Care | Payer: Medicaid Other | Source: Ambulatory Visit | Attending: Physician Assistant | Admitting: Physician Assistant

## 2023-06-08 DIAGNOSIS — M545 Low back pain, unspecified: Secondary | ICD-10-CM | POA: Diagnosis not present

## 2023-06-08 DIAGNOSIS — M47816 Spondylosis without myelopathy or radiculopathy, lumbar region: Secondary | ICD-10-CM | POA: Diagnosis not present

## 2023-06-12 NOTE — Progress Notes (Signed)
F/u with Jenna Combs to discuss

## 2023-06-13 ENCOUNTER — Ambulatory Visit (INDEPENDENT_AMBULATORY_CARE_PROVIDER_SITE_OTHER): Payer: Medicaid Other | Admitting: Orthopaedic Surgery

## 2023-06-13 DIAGNOSIS — M545 Low back pain, unspecified: Secondary | ICD-10-CM

## 2023-06-13 DIAGNOSIS — G8929 Other chronic pain: Secondary | ICD-10-CM | POA: Diagnosis not present

## 2023-06-13 NOTE — Progress Notes (Signed)
Office Visit Note   Patient: Jenna Combs           Date of Birth: Dec 26, 1990           MRN: [id number] Visit Date: 06/13/2023              Requested by: Bess Kinds, MD [address] [place],  [place] [address] PCP: Bess Kinds, MD   Assessment & Plan: Visit Diagnoses:  1. Chronic midline low back pain without sciatica     Plan: MRI findings consistent with L4-5 bilateral facet arthropathy.  There is reactive edema which could contribute to her symptoms.  No evidence of radiculopathy.  Patient would like to try facet injections with Dr. Alvester Morin.  Will continue with conservative management in the meantime.  Follow-Up Instructions: No follow-ups on file.   Orders:  No orders of the defined types were placed in this encounter.  No orders of the defined types were placed in this encounter.     Procedures: No procedures performed   Clinical Data: No additional findings.   Subjective: Chief Complaint  Patient presents with   Lower Back - Follow-up    HPI Patient returns today for MRI review.  She states she continues to have chronic pain in her back. Review of Systems  Constitutional: Negative.   HENT: Negative.    Eyes: Negative.   Respiratory: Negative.    Cardiovascular: Negative.   Endocrine: Negative.   Musculoskeletal: Negative.   Neurological: Negative.   Hematological: Negative.   Psychiatric/Behavioral: Negative.    All other systems reviewed and are negative.    Objective: Vital Signs: There were no vitals taken for this visit.  Physical Exam Vitals and nursing note reviewed.  Constitutional:      Appearance: She is well-developed.  HENT:     Head: Normocephalic and atraumatic.  Pulmonary:     Effort: Pulmonary effort is normal.  Abdominal:     Palpations: Abdomen is soft.  Musculoskeletal:     Cervical back: Neck supple.  Skin:    General: Skin is warm.     Capillary Refill: Capillary refill takes less than 2 seconds.   Neurological:     Mental Status: She is alert and oriented to person, place, and time.  Psychiatric:        Behavior: Behavior normal.        Thought Content: Thought content normal.        Judgment: Judgment normal.     Ortho Exam Exam nation of the lumbar spine is unchanged. Specialty Comments:  No specialty comments available.  Imaging: No results found.   PMFS History: Patient Active Problem List   Diagnosis Date Noted   Vaginal odor 12/01/2020   Healthcare maintenance 12/01/2020   Generalized anxiety disorder 11/11/2020   Hidradenitis suppurativa 11/02/2020   BMI 30.0-30.9,adult 11/02/2020   Lumbar pain 10/29/2020   Tobacco use 10/29/2020   BMI 40.0-44.9, adult (HCC) 10/29/2020   Intermittent asthma without complication 09/25/2018   Past Medical History:  Diagnosis Date   Asthma    Seizures (HCC)     Family History  Problem Relation Age of Onset   Diabetes Mother    Hypertension Father     Past Surgical History:  Procedure Laterality Date   BRAIN TUMOR EXCISION     As a child   Social History   Occupational History   Not on file  Tobacco Use   Smoking status: Some Days    Current packs/day: 0.25  Types: Cigarettes   Smokeless tobacco: Never  Vaping Use   Vaping status: Never Used  Substance and Sexual Activity   Alcohol use: No   Drug use: No   Sexual activity: Yes    Birth control/protection: None

## 2023-06-22 NOTE — Telephone Encounter (Signed)
Pt called regarding an appt for injections lower back. Pt advised to call her back. Carlye Grippe

## 2023-06-23 ENCOUNTER — Other Ambulatory Visit: Payer: Self-pay | Admitting: Physical Medicine and Rehabilitation

## 2023-06-23 MED ORDER — DIAZEPAM 5 MG PO TABS: ORAL_TABLET | ORAL | 0 refills | Status: DC

## 2023-06-23 NOTE — Telephone Encounter (Signed)
Spoke with patient and scheduled injection for 07/05/23. Patient aware driver needed. Patient needs preprocedure Valium sent to [facility]

## 2023-07-05 ENCOUNTER — Telehealth: Payer: Self-pay | Admitting: Physical Medicine and Rehabilitation

## 2023-07-05 ENCOUNTER — Other Ambulatory Visit: Payer: Self-pay | Admitting: Physical Medicine and Rehabilitation

## 2023-07-05 ENCOUNTER — Other Ambulatory Visit: Payer: Self-pay

## 2023-07-05 ENCOUNTER — Ambulatory Visit (INDEPENDENT_AMBULATORY_CARE_PROVIDER_SITE_OTHER): Payer: Medicaid Other | Admitting: Physical Medicine and Rehabilitation

## 2023-07-05 DIAGNOSIS — M47816 Spondylosis without myelopathy or radiculopathy, lumbar region: Secondary | ICD-10-CM

## 2023-07-05 MED ORDER — METHYLPREDNISOLONE ACETATE 80 MG/ML IJ SUSP: 80.0000 mg | Freq: Once | INTRAMUSCULAR | Status: DC

## 2023-07-05 MED ORDER — DIAZEPAM 5 MG PO TABS: ORAL_TABLET | ORAL | 0 refills | Status: DC

## 2023-07-05 NOTE — Telephone Encounter (Signed)
Patient was scheduled today for injection with Dr. Alvester Morin. She asked to be rescheduled for her apt when I brought her back, as she didn't have valium for her apt nor had a driver today. Can this please be rescheduled. Thank you.

## 2023-07-10 ENCOUNTER — Telehealth: Payer: Self-pay

## 2023-07-10 NOTE — Telephone Encounter (Signed)
She doesn't need surgery.  Basically just PT, exercise, OTC meds as needed and modify activity if needed.

## 2023-07-10 NOTE — Telephone Encounter (Signed)
Spoke with patient and she is very scared to get the injection. It needed to be rescheduled last week due to the nervousness of the injection. She would like to know if Dr. Roda Shutters has any other options for her other than the injection.

## 2023-07-10 NOTE — Telephone Encounter (Signed)
See previous encounter

## 2023-07-10 NOTE — Telephone Encounter (Signed)
-----   Message from [place], [place] E sent at 07/05/2023  4:05 PM EDT ----- She needs to reschedule injection from today, I already sent in Valium. Thanks

## 2023-07-10 NOTE — Progress Notes (Signed)
Patient was not seen today.

## 2023-07-10 NOTE — Progress Notes (Deleted)
Jenna Combs - 32 y.o. female MRN [id number]  Date of birth: [birth year]  Office Visit Note: Visit Date: 07/05/2023 PCP: Bess Kinds, MD Referred by: Bess Kinds, MD  Subjective: Chief Complaint  Patient presents with   Lower Back - Follow-up    Facet block   HPI:  Jenna Combs is a 32 y.o. female who comes in today at the request of Dr. Glee Arvin for planned Bilateral  L4-5 Lumbar facet/medial branch block with fluoroscopic guidance.  The patient has failed conservative care including home exercise, medications, time and activity modification.  This injection will be diagnostic and hopefully therapeutic.  Please see requesting physician notes for further details and justification.  Exam has shown concordant pain with facet joint loading.   ROS Otherwise per HPI.  Assessment & Plan: Visit Diagnoses:    ICD-10-CM   1. Spondylosis without myelopathy or radiculopathy, lumbar region  M47.816 XR C-ARM NO REPORT    DISCONTINUED: methylPREDNISolone acetate (DEPO-MEDROL) injection 80 mg    CANCELED: Facet Injection      Plan: No additional findings.   Meds & Orders:  Meds ordered this encounter  Medications   DISCONTD: methylPREDNISolone acetate (DEPO-MEDROL) injection 80 mg    Orders Placed This Encounter  Procedures   XR C-ARM NO REPORT    Follow-up: No follow-ups on file.   Procedures: No procedures performed      Clinical History: MRI LUMBAR SPINE WITHOUT CONTRAST   TECHNIQUE: Multiplanar, multisequence MR imaging of the lumbar spine was performed. No intravenous contrast was administered.   COMPARISON:  Lumbar spine radiographs 03/28/2023.   FINDINGS: Segmentation: Conventional numbering is assumed with 5 non-rib-bearing, lumbar type vertebral bodies.   Alignment:  Normal.   Vertebrae: Normal vertebral body heights. No suspicious bone lesions.   Conus medullaris and cauda equina: Conus extends to the L1 level. Conus and cauda equina  appear normal.   Paraspinal and other soft tissues: Unremarkable.   Disc levels:   No disc herniation, spinal canal stenosis or significant neural foraminal narrowing in the lumbar spine. Moderate bilateral facet arthropathy at L4-5 with periarticular edema.   IMPRESSION: 1. No disc herniation, spinal canal stenosis or significant neural foraminal narrowing in the lumbar spine. 2. Moderate bilateral facet arthropathy at L4-5 with periarticular edema.     Electronically Signed   By: Orvan Falconer M.D.   On: 06/12/2023 13:21     Objective:  VS:  HT:    WT:   BMI:     BP:   HR: bpm  TEMP: ( )  RESP:  Physical Exam Vitals and nursing note reviewed.  Constitutional:      General: She is not in acute distress.    Appearance: Normal appearance. She is obese. She is not ill-appearing.  HENT:     Head: Normocephalic and atraumatic.     Right Ear: External ear normal.     Left Ear: External ear normal.  Eyes:     Extraocular Movements: Extraocular movements intact.  Cardiovascular:     Rate and Rhythm: Normal rate.     Pulses: Normal pulses.  Pulmonary:     Effort: Pulmonary effort is normal. No respiratory distress.  Abdominal:     General: There is no distension.     Palpations: Abdomen is soft.  Musculoskeletal:        General: Tenderness present.     Cervical back: Neck supple.     Right lower leg: No edema.  Left lower leg: No edema.     Comments: Patient has good distal strength with no pain over the greater trochanters.  No clonus or focal weakness.  Skin:    Findings: No erythema, lesion or rash.  Neurological:     General: No focal deficit present.     Mental Status: She is alert and oriented to person, place, and time.     Sensory: No sensory deficit.     Motor: No weakness or abnormal muscle tone.     Coordination: Coordination normal.  Psychiatric:        Mood and Affect: Mood normal.        Behavior: Behavior normal.      Imaging: No  results found.

## 2023-07-11 NOTE — Telephone Encounter (Signed)
OTC meds don't help. She has no issues with activity, and is very active. Patient is going to get a second opinion.

## 2023-07-11 NOTE — Telephone Encounter (Signed)
thanks

## 2024-06-28 ENCOUNTER — Encounter (HOSPITAL_COMMUNITY): Payer: Self-pay

## 2024-06-28 ENCOUNTER — Inpatient Hospital Stay (HOSPITAL_COMMUNITY)
Admission: AD | Admit: 2024-06-28 | Discharge: 2024-06-28 | Disposition: A | Discharge status: Home / Self Care | Attending: Obstetrics and Gynecology | Admitting: Obstetrics and Gynecology

## 2024-06-28 ENCOUNTER — Encounter (HOSPITAL_COMMUNITY): Payer: Self-pay | Admitting: Obstetrics and Gynecology

## 2024-06-28 ENCOUNTER — Ambulatory Visit (HOSPITAL_COMMUNITY)
Admission: EM | Admit: 2024-06-28 | Discharge: 2024-06-28 | Disposition: A | Discharge status: Home / Self Care | Attending: Family Medicine | Admitting: Family Medicine

## 2024-06-28 ENCOUNTER — Other Ambulatory Visit: Payer: Self-pay

## 2024-06-28 DIAGNOSIS — N926 Irregular menstruation, unspecified: Secondary | ICD-10-CM

## 2024-06-28 DIAGNOSIS — Z3201 Encounter for pregnancy test, result positive: Secondary | ICD-10-CM

## 2024-06-28 DIAGNOSIS — Z3A01 Less than 8 weeks gestation of pregnancy: Secondary | ICD-10-CM

## 2024-06-28 LAB — POCT URINE PREGNANCY: Preg Test, Ur: POSITIVE — AB

## 2024-06-28 NOTE — MAU Provider Note (Cosign Needed Addendum)
 History     CSN: [id number]  Arrival date and time: 06/28/24 1735   Event Date/Time   First Provider Initiated Contact with Patient 06/28/24 1838      Chief Complaint  Patient presents with   Possible Pregnancy   HPI Ms. Jenna Combs is a 33 y.o. year old [id number] female at [redacted]w[redacted]d weeks gestation who presents to MAU reporting (+) HPT today since her period was a few days late. She wants to know how far along she is, because she is considering a TOP. She states, I have a 24 yo kid, other personal things going on right now and I just turned 33 years old. I just can't do another baby right now. She was referred to [facility] by Urgent Care. When she called there the prices quoted to her were not in her budget at this time. She states that [facility] advised her to come to [facility]U for an U/S to verify pregnancy and how many weeks she is. She has no pregnancy complaints today. She denies VB or pain.  OB History     Gravida  2   Para  1   Term  1   Preterm      AB      Living  1      SAB      IAB      Ectopic      Multiple      Live Births  1           Past Medical History:  Diagnosis Date   Asthma    Seizures (HCC)     Past Surgical History:  Procedure Laterality Date   BRAIN TUMOR EXCISION     As a child    Family History  Problem Relation Age of Onset   Diabetes Mother    Hypertension Father     Social History   Tobacco Use   Smoking status: Some Days    Current packs/day: 0.25    Types: Cigarettes   Smokeless tobacco: Never  Vaping Use   Vaping status: Never Used  Substance Use Topics   Alcohol use: No   Drug use: No    Allergies: No Known Allergies  Medications Prior to Admission  Medication Sig Dispense Refill Last Dose/Taking   albuterol  (PROVENTIL ) (2.5 MG/3ML) 0.083% nebulizer solution Take 3 mLs (2.5 mg total) by nebulization every 6 (six) hours as needed for wheezing or shortness of breath. 200 mL 5    albuterol   (VENTOLIN  HFA) 108 (90 Base) MCG/ACT inhaler Inhale 1-2 puffs into the lungs every 6 (six) hours as needed for wheezing or shortness of breath. 18 g 5     Review of Systems  Constitutional: Negative.   HENT: Negative.    Eyes: Negative.   Respiratory: Negative.    Cardiovascular: Negative.   Gastrointestinal: Negative.   Endocrine: Negative.   Genitourinary: Negative.   Musculoskeletal: Negative.   Skin: Negative.   Allergic/Immunologic: Negative.   Neurological: Negative.   Hematological: Negative.   Psychiatric/Behavioral: Negative.     Physical Exam   Blood pressure 117/70, pulse 79, temperature 98.7 F (37.1 C), temperature source Oral, resp. rate 18, height 5' 5 (1.651 m), weight 113 kg, last menstrual period 05/25/2024, SpO2 100%.  Physical Exam Vitals and nursing note reviewed.  Constitutional:      Appearance: Normal appearance. She is obese.  Cardiovascular:     Rate and Rhythm: Normal rate.  Pulmonary:  Effort: Pulmonary effort is normal.  Genitourinary:    Comments: Not indicated Musculoskeletal:        General: Normal range of motion.  Skin:    General: Skin is warm and dry.  Neurological:     Mental Status: She is alert and oriented to person, place, and time.  Psychiatric:        Mood and Affect: Mood normal.        Behavior: Behavior normal.        Thought Content: Thought content normal.        Judgment: Judgment normal.    MAU Course  Procedures  MDM UPT  Results for orders placed or performed during the hospital encounter of 06/28/24 (from the past 24 hours)  POCT urine pregnancy     Status: Abnormal   Collection Time: 06/28/24  1:07 PM  Result Value Ref Range   Preg Test, Ur Positive (A) Negative     Assessment and Plan  1. Positive pregnancy test (Primary) - Advised to f/u with an abortion care provider of her choice that offers U/S - We reserve U/S in this setting for emergencies only  2. [redacted] weeks gestation of pregnancy   -  Discharge home - Patient verbalized an understanding of the plan of care and agrees.   Javiana Anwar,CNM 06/28/2024, 6:38 PM

## 2024-06-28 NOTE — ED Provider Notes (Signed)
 [facility]    CSN: [id number] Arrival date & time: 06/28/24  1058      History   Chief Complaint Chief Complaint  Patient presents with   Possible Pregnancy    HPI Jenna Combs is a 33 y.o. female.    Possible Pregnancy  Here for late menstrual cycle.  Her last menstrual cycle began about August 9 and finished on August 12.  She has not had a menstrual cycle so far this month and due to home pregnancy test today that was positive.  No nausea or abdominal pain or fever.  NKDA    Past Medical History:  Diagnosis Date   Asthma    Seizures (HCC)     Patient Active Problem List   Diagnosis Date Noted   Vaginal odor 12/01/2020   Healthcare maintenance 12/01/2020   Generalized anxiety disorder 11/11/2020   Hidradenitis suppurativa 11/02/2020   BMI 30.0-30.9,adult 11/02/2020   Lumbar pain 10/29/2020   Tobacco use 10/29/2020   BMI 40.0-44.9, adult (HCC) 10/29/2020   Intermittent asthma without complication 09/25/2018    Past Surgical History:  Procedure Laterality Date   BRAIN TUMOR EXCISION     As a child    OB History   No obstetric history on file.      Home Medications    Prior to Admission medications   Medication Sig Start Date End Date Taking? Authorizing Provider  albuterol  (PROVENTIL ) (2.5 MG/3ML) 0.083% nebulizer solution Take 3 mLs (2.5 mg total) by nebulization every 6 (six) hours as needed for wheezing or shortness of breath. 06/26/19   Fulp, Cammie, MD  albuterol  (VENTOLIN  HFA) 108 (90 Base) MCG/ACT inhaler Inhale 1-2 puffs into the lungs every 6 (six) hours as needed for wheezing or shortness of breath. 06/26/19   Fulp, Lannie, MD    Family History Family History  Problem Relation Age of Onset   Diabetes Mother    Hypertension Father     Social History Social History   Tobacco Use   Smoking status: Some Days    Current packs/day: 0.25    Types: Cigarettes   Smokeless tobacco: Never  Vaping Use   Vaping  status: Never Used  Substance Use Topics   Alcohol use: No   Drug use: No     Allergies   Patient has no known allergies.   Review of Systems Review of Systems   Physical Exam Triage Vital Signs ED Triage Vitals  Encounter Vitals Group     BP 06/28/24 1244 112/77     Girls Systolic BP Percentile --      Girls Diastolic BP Percentile --      Boys Systolic BP Percentile --      Boys Diastolic BP Percentile --      Pulse Rate 06/28/24 1244 82     Resp 06/28/24 1244 16     Temp 06/28/24 1244 98.1 F (36.7 C)     Temp Source 06/28/24 1244 Oral     SpO2 06/28/24 1244 97 %     Weight --      Height --      Head Circumference --      Peak Flow --      Pain Score 06/28/24 1245 0     Pain Loc --      Pain Education --      Exclude from Growth Chart --    No data found.  Updated Vital Signs BP 112/77 (BP Location: Right Arm)  Pulse 82   Temp 98.1 F (36.7 C) (Oral)   Resp 16   LMP 05/25/2024 (Approximate)   SpO2 97%   Visual Acuity Right Eye Distance:   Left Eye Distance:   Bilateral Distance:    Right Eye Near:   Left Eye Near:    Bilateral Near:     Physical Exam Vitals reviewed.  Constitutional:      General: She is not in acute distress.    Appearance: She is not toxic-appearing.  Skin:    Coloration: Skin is not pale.  Neurological:     Mental Status: She is alert and oriented to person, place, and time.  Psychiatric:        Behavior: Behavior normal.      UC Treatments / Results  Labs (all labs ordered are listed, but only abnormal results are displayed) Labs Reviewed  POCT URINE PREGNANCY - Abnormal; Notable for the following components:      Result Value   Preg Test, Ur Positive (*)    All other components within normal limits    EKG   Radiology No results found.  Procedures Procedures (including critical care time)  Medications Ordered in UC Medications - No data to display  Initial Impression / Assessment and Plan / UC  Course  I have reviewed the triage vital signs and the nursing notes.  Pertinent labs & imaging results that were available during my care of the patient were reviewed by me and considered in my medical decision making (see chart for details).     UPT here is positive.  She states she is not interested in continuing the pregnancy, most likely.  She is given contact information for OB/GYN and also for [facility]. Work note provided Final Clinical Impressions(s) / UC Diagnoses   Final diagnoses:  Irregular menses  Positive pregnancy test     Discharge Instructions      Pregnancy test is positive  If you are interested in prenatal care, you can call the above noted OB/GYN office.  If you are not interested in continuing the pregnancy, you can go to a [facility] clinic.  Address is [address] in [place] Phone number is [phone].  You can also make appointments with them at [email or web address]     ED Prescriptions   None    PDMP not reviewed this encounter.   Vonna Sharlet POUR, MD 06/28/24 1318

## 2024-06-28 NOTE — ED Triage Notes (Signed)
 Patient here today to be tested for pregnancy. Patient states that she is late on her period and had a positive home test this morning.

## 2024-06-28 NOTE — MAU Note (Signed)
 Jenna Combs is a 33 y.o. at Unknown here in MAU reporting: she had a +UPT today, wants to know how far along she is due to considering termination.  Denies VB and pain  LMP: Unsure approximately, cycle ended on 8/12 Onset of complaint: today Pain score: 0 Vitals:   06/28/24 1822  BP: 117/70  Pulse: 79  Resp: 18  Temp: 98.7 F (37.1 C)  SpO2: 100%     FHT: NA  Lab orders placed from triage: None

## 2024-06-28 NOTE — Discharge Instructions (Addendum)
 Pregnancy test is positive  If you are interested in prenatal care, you can call the above noted OB/GYN office.  If you are not interested in continuing the pregnancy, you can go to a [facility] clinic.  Address is [address] in [place] Phone number is [phone].  You can also make appointments with them at [email or web address]

## 2024-11-26 ENCOUNTER — Ambulatory Visit: Admitting: Orthopaedic Surgery
